# Patient Record
Sex: Male | Born: 1975 | Hispanic: Yes | Marital: Married | Smoking: Current every day smoker
Health system: Southern US, Community
[De-identification: ages and names within clinical notes are randomized; demographics above are authoritative.]

## PROBLEM LIST (undated history)

## (undated) DIAGNOSIS — R519 Headache, unspecified: Secondary | ICD-10-CM

## (undated) DIAGNOSIS — F329 Major depressive disorder, single episode, unspecified: Secondary | ICD-10-CM

## (undated) DIAGNOSIS — F4310 Post-traumatic stress disorder, unspecified: Secondary | ICD-10-CM

## (undated) DIAGNOSIS — R7303 Prediabetes: Secondary | ICD-10-CM

## (undated) DIAGNOSIS — F32A Depression, unspecified: Secondary | ICD-10-CM

## (undated) DIAGNOSIS — N390 Urinary tract infection, site not specified: Secondary | ICD-10-CM

## (undated) HISTORY — PX: CERVICAL DISC ARTHROPLASTY: SHX587

## (undated) HISTORY — PX: TONSILLECTOMY: SUR1361

---
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
## 2012-11-25 ENCOUNTER — Encounter (HOSPITAL_COMMUNITY): Payer: Self-pay | Admitting: *Deleted

## 2012-11-25 ENCOUNTER — Emergency Department (HOSPITAL_COMMUNITY)
Admission: EM | Admit: 2012-11-25 | Discharge: 2012-11-25 | Disposition: A | Discharge status: Home / Self Care | Payer: 59 | Attending: Emergency Medicine | Admitting: Emergency Medicine

## 2012-11-25 DIAGNOSIS — M545 Low back pain, unspecified: Secondary | ICD-10-CM | POA: Insufficient documentation

## 2012-11-25 DIAGNOSIS — F17200 Nicotine dependence, unspecified, uncomplicated: Secondary | ICD-10-CM | POA: Insufficient documentation

## 2012-11-25 DIAGNOSIS — R3915 Urgency of urination: Secondary | ICD-10-CM | POA: Insufficient documentation

## 2012-11-25 DIAGNOSIS — R300 Dysuria: Secondary | ICD-10-CM | POA: Insufficient documentation

## 2012-11-25 DIAGNOSIS — R109 Unspecified abdominal pain: Secondary | ICD-10-CM | POA: Insufficient documentation

## 2012-11-25 DIAGNOSIS — N390 Urinary tract infection, site not specified: Secondary | ICD-10-CM | POA: Insufficient documentation

## 2012-11-25 HISTORY — DX: Urinary tract infection, site not specified: N39.0

## 2012-11-25 LAB — URINALYSIS, ROUTINE W REFLEX MICROSCOPIC
Glucose, UA: NEGATIVE mg/dL
Ketones, ur: NEGATIVE mg/dL
pH: 6 (ref 5.0–8.0)

## 2012-11-25 LAB — URINE MICROSCOPIC-ADD ON

## 2012-11-25 MED ORDER — ONDANSETRON HCL 8 MG PO TABS: 8.0000 mg | ORAL_TABLET | Freq: Three times a day (TID) | ORAL | Status: DC | PRN

## 2012-11-25 MED ORDER — CEFTRIAXONE SODIUM 1 G IJ SOLR
1.0000 g | Freq: Once | INTRAMUSCULAR | Status: AC
  Administered 2012-11-25: 1 g via INTRAMUSCULAR
  Filled 2012-11-25: qty 10

## 2012-11-25 MED ORDER — CEPHALEXIN 750 MG PO CAPS: 750.0000 mg | ORAL_CAPSULE | Freq: Three times a day (TID) | ORAL | Status: DC

## 2012-11-25 MED ORDER — IBUPROFEN 200 MG PO TABS
600.0000 mg | ORAL_TABLET | Freq: Once | ORAL | Status: AC
  Administered 2012-11-25: 600 mg via ORAL
  Filled 2012-11-25: qty 3

## 2012-11-25 MED ORDER — LIDOCAINE HCL 1 % IJ SOLN
INTRAMUSCULAR | Status: AC
  Administered 2012-11-25: 20 mL
  Filled 2012-11-25: qty 20

## 2012-11-25 MED ORDER — HYDROCODONE-ACETAMINOPHEN 5-325 MG PO TABS: 1.0000 | ORAL_TABLET | Freq: Four times a day (QID) | ORAL | Status: DC | PRN

## 2012-11-25 NOTE — ED Provider Notes (Signed)
History     CSN: [id number]  Arrival date & time 11/25/12  1058   First MD Initiated Contact with Patient 11/25/12 1120      Chief Complaint  Patient presents with  . Hematuria    (Consider location/radiation/quality/duration/timing/severity/associated sxs/prior treatment) Patient is a 37 y.o. male presenting with hematuria. The history is provided by the patient.  Hematuria Pertinent negatives include no chest pain, no abdominal pain, no headaches and no shortness of breath.  pt c/o blood in urine and urinary urgency, dysuria, since yesterday. No penile discharge, no known std exposure. No recent trauma or fall. No hx kidney stones. Prior hx single uncomplicated uti, states has same symptoms. Notes suprapubic area and lower back discomfort. No lateralizing pain. No scrotal or testicular pain or swelling. No fever or chills, t 99.6 in ed. No nvd. Having normal bms.      Past Medical History  Diagnosis Date  . UTI (lower urinary tract infection)     Past Surgical History  Procedure Laterality Date  . Cervical disc arthroplasty      No family history on file.  History  Substance Use Topics  . Smoking status: Current Every Day Smoker  . Smokeless tobacco: Not on file  . Alcohol Use: Yes     Comment: 1-2x/month      Review of Systems  Constitutional: Negative for fever and chills.  HENT: Negative for sore throat and neck pain.   Eyes: Negative for redness.  Respiratory: Negative for shortness of breath.   Cardiovascular: Negative for chest pain.  Gastrointestinal: Negative for nausea, vomiting and abdominal pain.  Genitourinary: Positive for hematuria. Negative for flank pain.  Musculoskeletal: Negative for joint swelling.  Skin: Negative for rash.  Neurological: Negative for headaches.  Hematological: Does not bruise/bleed easily.  Psychiatric/Behavioral: Negative for confusion.    Allergies  Review of patient's allergies indicates no known allergies.  Home  Medications   Current Outpatient Rx  Name  Route  Sig  Dispense  Refill  . ibuprofen (ADVIL,MOTRIN) 200 MG tablet   Oral   Take 400 mg by mouth every 6 (six) hours as needed for pain.           BP 122/67  Pulse 103  Temp(Src) 99.6 F (37.6 C) (Oral)  Resp 15  SpO2 98%  Physical Exam  Nursing note and vitals reviewed. Constitutional: He is oriented to person, place, and time. He appears well-developed and well-nourished. No distress.  HENT:  Nose: Nose normal.  Mouth/Throat: Oropharynx is clear and moist.  Eyes: Conjunctivae are normal. No scleral icterus.  Neck: Neck supple. No tracheal deviation present.  Cardiovascular: Normal rate, normal heart sounds and intact distal pulses.   Pulmonary/Chest: Effort normal. No accessory muscle usage. No respiratory distress.  Abdominal: Soft. Bowel sounds are normal. He exhibits no distension and no mass. There is no tenderness. There is no rebound and no guarding.  Genitourinary:  No cva tenderness. Normal ext genitalia, testes desc bil, non tender. No penile discharge.   Musculoskeletal: Normal range of motion. He exhibits no edema.  Neurological: He is alert and oriented to person, place, and time.  Skin: Skin is warm and dry. No rash noted. He is not diaphoretic.  Psychiatric: He has a normal mood and affect.    ED Course  Procedures (including critical care time)   Results for orders placed during the hospital encounter of 11/25/12  URINALYSIS, ROUTINE W REFLEX MICROSCOPIC      Result Value Range  Color, Urine AMBER (*) YELLOW   APPearance CLOUDY (*) CLEAR   Specific Gravity, Urine 1.024  1.005 - 1.030   pH 6.0  5.0 - 8.0   Glucose, UA NEGATIVE  NEGATIVE mg/dL   Hgb urine dipstick LARGE (*) NEGATIVE   Bilirubin Urine SMALL (*) NEGATIVE   Ketones, ur NEGATIVE  NEGATIVE mg/dL   Protein, ur 30 (*) NEGATIVE mg/dL   Urobilinogen, UA 1.0  0.0 - 1.0 mg/dL   Nitrite NEGATIVE  NEGATIVE   Leukocytes, UA LARGE (*) NEGATIVE   URINE MICROSCOPIC-ADD ON      Result Value Range   Squamous Epithelial / LPF RARE  RARE   WBC, UA 11-20  <3 WBC/hpf   RBC / HPF 11-20  <3 RBC/hpf   Bacteria, UA FEW (*) RARE   Urine-Other MUCOUS PRESENT         MDM  Labs.  Reviewed nursing notes and prior charts for additional history.    Rocephin im.  Recheck taking po, abd soft nt. No nv.   Hr 84 rr 16. Pt appears stable for d/c.   Given recurrent uti, will refer to urology for follow up.  Urine culture pending.        Suzi Roots, MD 11/25/12 1229

## 2012-11-25 NOTE — ED Notes (Signed)
Pt reports blood in urine since yesterday. Sts Hx of same and was Dx with dehydration and UTI. C/o urinary frequency, urgency and burning.

## 2012-11-25 NOTE — Progress Notes (Signed)
Noted no pcp listed Confirms with pt he does not have a pcp CM reviewed with pt how to obtain an in network pcp if recommended at d/c via [facility] toll free number or web site Pt voiced understanding and appreciation

## 2012-11-25 NOTE — ED Notes (Signed)
MD at bedside. 

## 2012-11-27 LAB — URINE CULTURE

## 2014-02-01 ENCOUNTER — Ambulatory Visit (INDEPENDENT_AMBULATORY_CARE_PROVIDER_SITE_OTHER): Payer: 59 | Admitting: Family Medicine

## 2014-02-01 ENCOUNTER — Ambulatory Visit: Payer: 59

## 2014-02-01 VITALS — BP 122/76 | HR 82 | Temp 98.9°F | Resp 16 | Ht 75.0 in | Wt 274.0 lb

## 2014-02-01 DIAGNOSIS — M545 Low back pain, unspecified: Secondary | ICD-10-CM

## 2014-02-01 DIAGNOSIS — IMO0002 Reserved for concepts with insufficient information to code with codable children: Secondary | ICD-10-CM

## 2014-02-01 DIAGNOSIS — M5430 Sciatica, unspecified side: Secondary | ICD-10-CM

## 2014-02-01 MED ORDER — PREDNISONE 20 MG PO TABS: ORAL_TABLET | ORAL | Status: DC

## 2014-02-01 MED ORDER — CYCLOBENZAPRINE HCL 10 MG PO TABS: ORAL_TABLET | ORAL | Status: DC

## 2014-02-01 MED ORDER — OXYCODONE-ACETAMINOPHEN 5-325 MG PO TABS: 1.0000 | ORAL_TABLET | Freq: Three times a day (TID) | ORAL | Status: DC | PRN

## 2014-02-01 MED ORDER — MELOXICAM 15 MG PO TABS: 15.0000 mg | ORAL_TABLET | Freq: Every day | ORAL | Status: DC | PRN

## 2014-02-01 NOTE — Progress Notes (Signed)
Chief Complaint:  Chief Complaint  Patient presents with  . Back Pain    Lower, X 2 days    HPI: Tyler Atkins is a 38 y.o. male who is here for  2 day history of low back pain after helping sister in law move Friday morning, he has some shocks being sent down the back of his thighs on both sides, worse when he lays down. 10/10. He has tried advil and aleve without releif. He has had prior back injuries but nothing like this. He deneis incontinence.  No fevers or chills. He does not feel weak. Getting up the next few step are dreadful.  HE has worse pain sleeping. He has a cervical disc herniation and this feels worse.   Past Medical History  Diagnosis Date  . UTI (lower urinary tract infection)    Past Surgical History  Procedure Laterality Date  . Cervical disc arthroplasty     History   Social History  . Marital Status: Married    Spouse Name: N/A    Number of Children: N/A  . Years of Education: N/A   Social History Main Topics  . Smoking status: Current Every Day Smoker  . Smokeless tobacco: None  . Alcohol Use: Yes     Comment: 1-2x/month  . Drug Use: No  . Sexual Activity: None   Other Topics Concern  . None   Social History Narrative  . None   History reviewed. No pertinent family history. No Known Allergies Prior to Admission medications   Not on File     ROS: The patient denies fevers, chills, night sweats, unintentional weight loss, chest pain, palpitations, wheezing, dyspnea on exertion, nausea, vomiting, abdominal pain, dysuria, hematuria, melena,   All other systems have been reviewed and were otherwise negative with the exception of those mentioned in the HPI and as above.    PHYSICAL EXAM: Filed Vitals:   02/01/14 0945  BP: 122/76  Pulse: 82  Temp: 98.9 F (37.2 C)  Resp: 16   Filed Vitals:   02/01/14 0945  Height: 6\' 3"  (1.905 m)  Weight: 274 lb (124.286 kg)   Body mass index is 34.25 kg/(m^2).  General: Alert, no acute  distress HEENT:  Normocephalic, atraumatic, oropharynx patent. EOMI, PERRLA Cardiovascular:  Regular rate and rhythm, no rubs murmurs or gallops.  No Carotid bruits, radial pulse intact. No pedal edema.  Respiratory: Clear to auscultation bilaterally.  No wheezes, rales, or rhonchi.  No cyanosis, no use of accessory musculature GI: No organomegaly, abdomen is soft and non-tender, positive bowel sounds.  No masses. Skin: No rashes. Neurologic: Facial musculature symmetric. Psychiatric: Patient is appropriate throughout our interaction. Lymphatic: No cervical lymphadenopathy Musculoskeletal: Gait intact. + paramsk tenderness  Decrease ROM in flexion due to pain 5/5 strength, 2/2 DTRs No saddle anesthesia Straight leg positvie Hip and knee exam--normal    LABS:    EKG/XRAY:   Primary [facility]. Neg fx/dislocation   ASSESSMENT/PLAN: Encounter Diagnoses  Name Primary?  . Lumbar pain Yes  . Sprain and strain of back   . Sciatica    Rx prednisone taper, flexeril, roxicet Rx mobic, to take prn after predniosone is completed Recautions given  Work note given F/u prn Normal narcotic profile   Gross sideeffects, risk and benefits, and alternatives of medications d/w patient. Patient is aware that all medications have potential sideeffects and we are unable to predict every sideeffect or drug-drug interaction that may  occur.  Lenell Antuhao P Le, DO 02/01/2014 11:41 AM

## 2014-02-01 NOTE — Patient Instructions (Signed)
Sciatica °Sciatica is pain, weakness, numbness, or tingling along the path of the sciatic nerve. The nerve starts in the lower back and runs down the back of each leg. The nerve controls the muscles in the lower leg and in the back of the knee, while also providing sensation to the back of the thigh, lower leg, and the sole of your foot. Sciatica is a symptom of another medical condition. For instance, nerve damage or certain conditions, such as a herniated disk or bone spur on the spine, pinch or put pressure on the sciatic nerve. This causes the pain, weakness, or other sensations normally associated with sciatica. Generally, sciatica only affects one side of the body. °CAUSES  °· Herniated or slipped disc. °· Degenerative disk disease. °· A pain disorder involving the narrow muscle in the buttocks (piriformis syndrome). °· Pelvic injury or fracture. °· Pregnancy. °· Tumor (rare). °SYMPTOMS  °Symptoms can vary from mild to very severe. The symptoms usually travel from the low back to the buttocks and down the back of the leg. Symptoms can include: °· Mild tingling or dull aches in the lower back, leg, or hip. °· Numbness in the back of the calf or sole of the foot. °· Burning sensations in the lower back, leg, or hip. °· Sharp pains in the lower back, leg, or hip. °· Leg weakness. °· Severe back pain inhibiting movement. °These symptoms may get worse with coughing, sneezing, laughing, or prolonged sitting or standing. Also, being overweight may worsen symptoms. °DIAGNOSIS  °Your caregiver will perform a physical exam to look for common symptoms of sciatica. He or she may ask you to do certain movements or activities that would trigger sciatic nerve pain. Other tests may be performed to find the cause of the sciatica. These may include: °· Blood tests. °· X-rays. °· Imaging tests, such as an MRI or CT scan. °TREATMENT  °Treatment is directed at the cause of the sciatic pain. Sometimes, treatment is not necessary  and the pain and discomfort goes away on its own. If treatment is needed, your caregiver may suggest: °· Over-the-counter medicines to relieve pain. °· Prescription medicines, such as anti-inflammatory medicine, muscle relaxants, or narcotics. °· Applying heat or ice to the painful area. °· Steroid injections to lessen pain, irritation, and inflammation around the nerve. °· Reducing activity during periods of pain. °· Exercising and stretching to strengthen your abdomen and improve flexibility of your spine. Your caregiver may suggest losing weight if the extra weight makes the back pain worse. °· Physical therapy. °· Surgery to eliminate what is pressing or pinching the nerve, such as a bone spur or part of a herniated disk. °HOME CARE INSTRUCTIONS  °· Only take over-the-counter or prescription medicines for pain or discomfort as directed by your caregiver. °· Apply ice to the affected area for 20 minutes, 3 4 times a day for the first 48 72 hours. Then try heat in the same way. °· Exercise, stretch, or perform your usual activities if these do not aggravate your pain. °· Attend physical therapy sessions as directed by your caregiver. °· Keep all follow-up appointments as directed by your caregiver. °· Do not wear high heels or shoes that do not provide proper support. °· Check your mattress to see if it is too soft. A firm mattress may lessen your pain and discomfort. °SEEK IMMEDIATE MEDICAL CARE IF:  °· You lose control of your bowel or bladder (incontinence). °· You have increasing weakness in the lower back,   pelvis, buttocks, or legs. °· You have redness or swelling of your back. °· You have a burning sensation when you urinate. °· You have pain that gets worse when you lie down or awakens you at night. °· Your pain is worse than you have experienced in the past. °· Your pain is lasting longer than 4 weeks. °· You are suddenly losing weight without reason. °MAKE SURE YOU: °· Understand these  instructions. °· Will watch your condition. °· Will get help right away if you are not doing well or get worse. °Document Released: 09/04/2001 Document Revised: 03/11/2012 Document Reviewed: 01/20/2012 °[employer]® Patient Information ©2014 [employer]. °Back Pain, Adult °Low back pain is very common. About 1 in 5 people have back pain. The cause of low back pain is rarely dangerous. The pain often gets better over time. About half of people with a sudden onset of back pain feel better in just 2 weeks. About 8 in 10 people feel better by 6 weeks.  °CAUSES °Some common causes of back pain include: °· Strain of the muscles or ligaments supporting the spine. °· Wear and tear (degeneration) of the spinal discs. °· Arthritis. °· Direct injury to the back. °DIAGNOSIS °Most of the time, the direct cause of low back pain is not known. However, back pain can be treated effectively even when the exact cause of the pain is unknown. Answering your caregiver's questions about your overall health and symptoms is one of the most accurate ways to make sure the cause of your pain is not dangerous. If your caregiver needs more information, he or she may order lab work or imaging tests (X-rays or MRIs). However, even if imaging tests show changes in your back, this usually does not require surgery. °HOME CARE INSTRUCTIONS °For many people, back pain returns. Since low back pain is rarely dangerous, it is often a condition that people can learn to manage on their own.  °· Remain active. It is stressful on the back to sit or stand in one place. Do not sit, drive, or stand in one place for more than 30 minutes at a time. Take short walks on level surfaces as soon as pain allows. Try to increase the length of time you walk each day. °· Do not stay in bed. Resting more than 1 or 2 days can delay your recovery. °· Do not avoid exercise or work. Your body is made to move. It is not dangerous to be active, even though your back may hurt. Your  back will likely heal faster if you return to being active before your pain is gone. °· Pay attention to your body when you  bend and lift. Many people have less discomfort when lifting if they bend their knees, keep the load close to their bodies, and avoid twisting. Often, the most comfortable positions are those that put less stress on your recovering back. °· Find a comfortable position to sleep. Use a firm mattress and lie on your side with your knees slightly bent. If you lie on your back, put a pillow under your knees. °· Only take over-the-counter or prescription medicines as directed by your caregiver. Over-the-counter medicines to reduce pain and inflammation are often the most helpful. Your caregiver may prescribe muscle relaxant drugs. These medicines help dull your pain so you can more quickly return to your normal activities and healthy exercise. °· Put ice on the injured area. °· Put ice in a plastic bag. °· Place a towel between your skin and the bag. °· Leave the ice on for 15-20 minutes, 03-04 times a day for   the first 2 to 3 days. After that, ice and heat may be alternated to reduce pain and spasms. °· Ask your caregiver about trying back exercises and gentle massage. This may be of some benefit. °· Avoid feeling anxious or stressed. Stress increases muscle tension and can worsen back pain. It is important to recognize when you are anxious or stressed and learn ways to manage it. Exercise is a great option. °SEEK MEDICAL CARE IF: °· You have pain that is not relieved with rest or medicine. °· You have pain that does not improve in 1 week. °· You have new symptoms. °· You are generally not feeling well. °SEEK IMMEDIATE MEDICAL CARE IF:  °· You have pain that radiates from your back into your legs. °· You develop new bowel or bladder control problems. °· You have unusual weakness or numbness in your arms or legs. °· You develop nausea or vomiting. °· You develop abdominal pain. °· You feel  faint. °Document Released: 09/10/2005 Document Revised: 03/11/2012 Document Reviewed: 01/29/2011 °ExitCare® Patient Information ©2014 [employer]. ° °

## 2014-07-15 ENCOUNTER — Ambulatory Visit (INDEPENDENT_AMBULATORY_CARE_PROVIDER_SITE_OTHER): Payer: BC Managed Care – PPO | Admitting: Family Medicine

## 2014-07-15 VITALS — BP 114/76 | HR 73 | Temp 98.8°F | Resp 18 | Ht 74.0 in | Wt 261.8 lb

## 2014-07-15 DIAGNOSIS — M5441 Lumbago with sciatica, right side: Secondary | ICD-10-CM

## 2014-07-15 MED ORDER — OXYCODONE-ACETAMINOPHEN 5-325 MG PO TABS: 1.0000 | ORAL_TABLET | Freq: Three times a day (TID) | ORAL | Status: DC | PRN

## 2014-07-15 MED ORDER — PREDNISONE 20 MG PO TABS: ORAL_TABLET | ORAL | Status: DC

## 2014-07-15 NOTE — Patient Instructions (Signed)
We will work on getting you set up for an MRI of your back. This will help us determine exactly what is going on in your spine- I suspect you have a bulging disc pushing on a nerve and causing pain.  Use the steroid as directed and the pain medication as needed.  Don't forget that pain medication can cause constipation- you should add a stool softener daily to prevent painful stools.

## 2014-07-15 NOTE — Progress Notes (Signed)
[facility] [address], [place] [id number] [phone]  Date:  07/15/2014   Name:  Tyler BaizeJose Atkins   DOB:  11/03/1975   MRN:  [id number]  PCP:  No PCP Per Patient    Chief Complaint: Back Pain   History of Present Illness:  Tyler BaizeJose Atkins is a 38 y.o. very pleasant male patient who presents with the following:  He is here today with recurrent back pain. He was here in May with the same sx.  He is again having pain down his right leg.  We used prednisone and mobic in the Spring- he felt better "about the next day."  Sx returned shortly after he finished the prednisone.  He has has this pain off an on for the last 4-5 years now.  No lumbar MRI yet He did have surgery on his cervical spine in the past.  [place]   He is otherwise generally in good health.   He does note numbness and weakness in his leg that will come and go.  He is ok when he is laying down, worse with standing or prolonged sitting.  Yesterday he noted that he had pain with BM due to constipation, but no genital numbness or accidents.    He is otherwise generally in good health  There are no active problems to display for this patient.   Past Medical History  Diagnosis Date  . UTI (lower urinary tract infection)     Past Surgical History  Procedure Laterality Date  . Cervical disc arthroplasty      History  Substance Use Topics  . Smoking status: Current Every Day Smoker  . Smokeless tobacco: Not on file  . Alcohol Use: Yes     Comment: 1-2x/month    Family History  Problem Relation Age of Onset  . Cancer Mother   . Heart disease Father     No Known Allergies  Medication list has been reviewed and updated.  No current outpatient prescriptions on file prior to visit.   No current facility-administered medications on file prior to visit.    Review of Systems:  As per HPI- otherwise negative.   Physical Examination: Filed Vitals:   07/15/14  1236  BP: 114/76  Pulse: 73  Temp: 98.8 F (37.1 C)  Resp: 18   Filed Vitals:   07/15/14 1236  Height: 6\' 2"  (1.88 m)  Weight: 261 lb 12.8 oz (118.752 kg)   Body mass index is 33.6 kg/(m^2). Ideal Body Weight: Weight in (lb) to have BMI = 25: 194.3  GEN: WDWN, NAD, Non-toxic, A & O x 3, tall, looks well HEENT: Atraumatic, Normocephalic. Neck supple. No masses, No LAD. Ears and Nose: No external deformity. CV: RRR, No M/G/R. No JVD. No thrill. No extra heart sounds. PULM: CTA B, no wheezes, crackles, rhonchi. No retractions. No resp. distress. No accessory muscle use. EXTR: No c/c/e NEURO Normal gait.  PSYCH: Normally interactive. Conversant. Not depressed or anxious appearing.  Calm demeanor.  Lumbar spine: he is tender over the right sciatic notch.   Pain down right leg. positive SLR on the right.  Normal strength and sensation of both LE, reduced patellar DTR- symmetrical bilaterally  Assessment and Plan: Right-sided low back pain with right-sided sciatica - Plan: MR Lumbar Spine Wo Contrast, predniSONE (DELTASONE) 20 MG tablet, oxyCODONE-acetaminophen (ROXICET) 5-325 MG per tablet  Right sided lower back pain with radiation down leg.  Will treat with  prednisone as above and pain medication. Cautioned regarding sedation, no driving while on percocet.  Will work towards MRI  Cautioned regarding signs and sx of cauda equina- seek care right away if these recur.  Let us know if not feeling better in the next couple of days  Signed Abbe AmsterdamJessica Copland, MD

## 2014-07-22 ENCOUNTER — Telehealth: Payer: Self-pay | Admitting: Family Medicine

## 2014-07-22 ENCOUNTER — Ambulatory Visit
Admission: RE | Admit: 2014-07-22 | Discharge: 2014-07-22 | Disposition: A | Discharge status: Home / Self Care | Payer: BC Managed Care – PPO | Source: Ambulatory Visit | Attending: Family Medicine | Admitting: Family Medicine

## 2014-07-22 DIAGNOSIS — M5020 Other cervical disc displacement, unspecified cervical region: Secondary | ICD-10-CM

## 2014-07-22 DIAGNOSIS — M5126 Other intervertebral disc displacement, lumbar region: Secondary | ICD-10-CM

## 2014-07-22 DIAGNOSIS — M5441 Lumbago with sciatica, right side: Secondary | ICD-10-CM

## 2014-07-22 NOTE — Telephone Encounter (Signed)
Called to go over his MRI report.  He does have nerve impingement. He is feeling better but now 100%, will soon finish his prednisone taper.    EXAM: MRI LUMBAR SPINE WITHOUT CONTRAST  TECHNIQUE: Multiplanar, multisequence MR imaging of the lumbar spine was performed. No intravenous contrast was administered.  [facility]  FINDINGS: Normal lumbar segmentation depicted on comparison. Stable and normal vertebral height and alignment. No marrow edema or evidence of acute osseous abnormality.  Visualized lower thoracic spinal cord is normal with conus medularis at L1.  Visualized abdominal viscera and paraspinal soft tissues are within normal limits.  T12-L1: Negative.  L1-L2: Negative.  L2-L3: Negative.  L3-L4: Negative.  L4-L5: Mild disc desiccation. Circumferential disc bulge, broad-based posterior and biforaminal component of disc. Some endplate osteophytosis. Mild degenerative L4 inferior endplate changes. No significant spinal stenosis. No convincing foraminal stenosis.  However, there is subtle caudal disc extrusion to the right of midline, best seen on series 5, image 6 and series 3, image 6 and series 6, image 27 corresponding to the L5 vertebral body level. This appears to be a sequestered fragment measuring 6 x 8 x 15 mm (AP by transverse by CC). This severely affects the descending right L5 nerve roots in the lateral recess.  L5-S1: Negative.  IMPRESSION: 1. 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 2. No significant lumbar spinal stenosis, and other levels are within normal limits for age.   Will refer to NSG for further evaluation and treatment.  He will seek care if any worsening in the meantime.

## 2014-09-22 ENCOUNTER — Ambulatory Visit (INDEPENDENT_AMBULATORY_CARE_PROVIDER_SITE_OTHER): Payer: BC Managed Care – PPO | Admitting: Emergency Medicine

## 2014-09-22 VITALS — BP 122/76 | HR 77 | Temp 97.6°F | Resp 18 | Ht 74.75 in | Wt 275.0 lb

## 2014-09-22 DIAGNOSIS — J0140 Acute pansinusitis, unspecified: Secondary | ICD-10-CM

## 2014-09-22 DIAGNOSIS — J209 Acute bronchitis, unspecified: Secondary | ICD-10-CM

## 2014-09-22 MED ORDER — HYDROCOD POLST-CHLORPHEN POLST 10-8 MG/5ML PO LQCR: 5.0000 mL | Freq: Two times a day (BID) | ORAL | Status: DC | PRN

## 2014-09-22 MED ORDER — PSEUDOEPHEDRINE-GUAIFENESIN ER 60-600 MG PO TB12: 1.0000 | ORAL_TABLET | Freq: Two times a day (BID) | ORAL | Status: AC

## 2014-09-22 MED ORDER — AMOXICILLIN-POT CLAVULANATE 875-125 MG PO TABS: 1.0000 | ORAL_TABLET | Freq: Two times a day (BID) | ORAL | Status: DC

## 2014-09-22 NOTE — Progress Notes (Signed)
  [facility] [address], [place] [id number] [phone]  Date:  09/22/2014   Name:  Tyler Atkins   DOB:  07/08/1976   MRN:  [id number]  PCP:  No PCP Per Patient    Chief Complaint: Nasal Congestion; Cough; Fever; and Fatigue   History of Present Illness:  Tyler Atkins is a 38 y.o. very pleasant male patient who presents with the following:  Ill for three weeks with fever, chills, malaise and myalgias Night sweats Has nasal congestion and clear nasal drainage.  Has purulent post nasal drainage. Sore throat Cough productive of purulent sputum. Some wheezing and exertional shortness of breath past few days No nausea or vomiting.  No stool change No rash No improvement with over the counter medications or other home remedies.  Denies other complaint or health concern today.   There are no active problems to display for this patient.   Past Medical History  Diagnosis Date  . UTI (lower urinary tract infection)     Past Surgical History  Procedure Laterality Date  . Cervical disc arthroplasty      History  Substance Use Topics  . Smoking status: Current Every Day Smoker  . Smokeless tobacco: Not on file  . Alcohol Use: Yes     Comment: 1-2x/month    Family History  Problem Relation Age of Onset  . Cancer Mother   . Heart disease Father     No Known Allergies  Medication list has been reviewed and updated.  Current Outpatient Prescriptions on File Prior to Visit  Medication Sig Dispense Refill  . oxyCODONE-acetaminophen (ROXICET) 5-325 MG per tablet Take 1-2 tablets by mouth every 8 (eight) hours as needed for severe pain. (Patient not taking: Reported on 09/22/2014) 30 tablet 0  . predniSONE (DELTASONE) 20 MG tablet Take 3 tabs po daily x 3 days, then 2 tabs po daily x 3 days, then 1 tab po x 3 days, then 1/2 tab po daily x 3 days (Patient not taking: Reported on 09/22/2014) 20 tablet 0   No current facility-administered medications  on file prior to visit.    Review of Systems:  As per HPI, otherwise negative.    Physical Examination: Filed Vitals:   09/22/14 1449  BP: 122/76  Pulse: 77  Temp: 97.6 F (36.4 C)  Resp: 18   Filed Vitals:   09/22/14 1449  Height: 6' 2.75" (1.899 m)  Weight: 275 lb (124.739 kg)   Body mass index is 34.59 kg/(m^2). Ideal Body Weight: Weight in (lb) to have BMI = 25: 198.3  GEN: WDWN, moderate distress, Non-toxic, A & O x 3 HEENT: Atraumatic, Normocephalic. Neck supple. No masses, No LAD. Ears and Nose: No external deformity. CV: RRR, No M/G/R. No JVD. No thrill. No extra heart sounds. PULM: CTA B, no wheezes, crackles, rhonchi. No retractions. No resp. distress. No accessory muscle use. ABD: S, NT, ND, +BS. No rebound. No HSM. EXTR: No c/c/e NEURO Normal gait.  PSYCH: Normally interactive. Conversant. Not depressed or anxious appearing.  Calm demeanor.    Assessment and Plan: Sinusitis Bronchitis augmentin mucinex tussionex   Signed,  Phillips OdorJeffery Devarius Nelles, MD

## 2014-09-22 NOTE — Patient Instructions (Signed)

## 2017-06-03 ENCOUNTER — Emergency Department (HOSPITAL_COMMUNITY)
Admission: EM | Admit: 2017-06-03 | Discharge: 2017-06-03 | Disposition: A | Discharge status: Home / Self Care | Payer: Self-pay | Attending: Emergency Medicine | Admitting: Emergency Medicine

## 2017-06-03 ENCOUNTER — Encounter (HOSPITAL_COMMUNITY): Payer: Self-pay

## 2017-06-03 DIAGNOSIS — M5442 Lumbago with sciatica, left side: Secondary | ICD-10-CM | POA: Insufficient documentation

## 2017-06-03 DIAGNOSIS — F17210 Nicotine dependence, cigarettes, uncomplicated: Secondary | ICD-10-CM | POA: Insufficient documentation

## 2017-06-03 HISTORY — DX: Post-traumatic stress disorder, unspecified: F43.10

## 2017-06-03 MED ORDER — METHOCARBAMOL 500 MG PO TABS: 500.0000 mg | ORAL_TABLET | Freq: Two times a day (BID) | ORAL | 0 refills | Status: DC

## 2017-06-03 MED ORDER — NAPROXEN 500 MG PO TABS: 500.0000 mg | ORAL_TABLET | Freq: Two times a day (BID) | ORAL | 0 refills | Status: DC

## 2017-06-03 NOTE — Discharge Instructions (Signed)
Please take muscle relaxer medication (Robaxin) and anti-inflammatory medication (Naprosyn) daily for you low back pain  Continue with applying heat  [facility] an appointment with [facility] for health maintenance concerns and for follow up of low back pain  Continue taking the antibiotic prescribed to you by your doctor.  Please return if you have difficulty urinating, have new numbness or new or worsening symptoms

## 2017-06-03 NOTE — ED Provider Notes (Signed)
[facility]-EMERGENCY DEPT Provider Note   CSN: [id number] Arrival date & time: 06/03/17  1110     History   Chief Complaint Chief Complaint  Patient presents with  . Back Pain    HPI Tyler Atkins is a 41 y.o. male.  HPI   Tyler Atkins is a 41yo male with a history of chronic lower back pain, cervical disc arthroplasty, PTSD who presents to the Emergency Department for evaluation of lower back pain and tingling down his left leg. Pain started approximately 9 days ago, has gradually worsened and is now sharp and 10/10 in severity. It is located in the middle lower back, and radiates down his left leg when he moves from a sitting to standing position. Walking and sitting make the pain worse, he has not taken any medication for pain. He has tried heat on the back without improvement. He denies inciting injury. He states that he walks about 8miles a day at work and his pain is making his job more difficult. He also states that he saw the dentist for a tooth ache 10 days ago and was given tylenol with codeine, an antibiotic and scheduled to get the tooth pulled in a few days. He thinks the back pain is worse when taking the antibiotic for his dental pain. He denies past history of cancer, denies IVDU. Denies abdominal pain, N/V, diarrhea, fever, hematuria, urinary retention, incontinence, saddle anesthesia, weakness. He states that he has had episodes of lower back pain in the past, and got epidural steroid injection years ago which helped him. He states that he does not have a Primary Care Provider.    Past Medical History:  Diagnosis Date  . PTSD (post-traumatic stress disorder)   . UTI (lower urinary tract infection)     There are no active problems to display for this patient.   Past Surgical History:  Procedure Laterality Date  . CERVICAL DISC ARTHROPLASTY         Home Medications    Prior to Admission medications   Medication Sig Start Date End Date Taking? Authorizing Provider    acetaminophen-codeine (TYLENOL #3) 300-30 MG tablet Take 1 tablet by mouth every 6 (six) hours as needed. for pain 05/22/17  Yes [provider]  amoxicillin (AMOXIL) 500 MG capsule TAKE 1 TABLET BY MOUTH EVERY 8 HOURS UNTIL GONE 05/22/17  Yes [provider]  sertraline (ZOLOFT) 100 MG tablet Take 100 mg by mouth daily. 05/20/17  Yes [provider]  amoxicillin-clavulanate (AUGMENTIN) 875-125 MG per tablet Take 1 tablet by mouth 2 (two) times daily. Patient not taking: Reported on 06/03/2017 09/22/14   Carmelina DaneAnderson, Jeffery S, MD  chlorpheniramine-HYDROcodone [facility](TUSSIONEX PENNKINETIC ER) 10-8 MG/[facility]ML [facility] Take 5 mLs by mouth every 12 (twelve) hours as needed. Patient not taking: Reported on 06/03/2017 09/22/14   Carmelina DaneAnderson, Jeffery S, MD  methocarbamol (ROBAXIN) 500 MG tablet Take 1 tablet (500 mg total) by mouth 2 (two) times daily. 06/03/17   Kellie ShropshireShrosbree, Shaconda Hajduk J, PA-C  naproxen (NAPROSYN) 500 MG tablet Take 1 tablet (500 mg total) by mouth 2 (two) times daily. 06/03/17   Kellie ShropshireShrosbree, Chasiti Waddington J, PA-C  oxyCODONE-acetaminophen (ROXICET) 5-325 MG per tablet Take 1-2 tablets by mouth every 8 (eight) hours as needed for severe pain. Patient not taking: Reported on 09/22/2014 07/15/14   Copland, Gwenlyn FoundJessica C, MD  predniSONE (DELTASONE) 20 MG tablet Take 3 tabs po daily x 3 days, then 2 tabs po daily x 3 days, then 1 tab po x 3 days, then  1/2 tab po daily x 3 days Patient not taking: Reported on 09/22/2014 07/15/14   Copland, Gwenlyn Found, MD    Family History Family History  Problem Relation Age of Onset  . Cancer Mother   . Heart disease Father     Social History Social History  Substance Use Topics  . Smoking status: Current Every Day Smoker    Packs/day: 1.00    Types: Cigarettes  . Smokeless tobacco: Never Used  . Alcohol use Yes     Comment: 1-2x/month     Allergies   Patient has no known allergies.   Review of Systems Review of Systems  Constitutional: Negative for  appetite change, chills and fever.  Gastrointestinal: Negative for abdominal pain, diarrhea, nausea and vomiting.  Genitourinary: Negative for difficulty urinating, dysuria, flank pain, hematuria and testicular pain.  Musculoskeletal: Positive for back pain. Negative for gait problem, neck pain and neck stiffness.  Skin: Negative for rash and wound.  Neurological: Negative for weakness, light-headedness, numbness and headaches.     Physical Exam Updated Vital Signs BP (!) 139/94 (BP Location: Left Arm)   Pulse 68   Temp 98.9 F (37.2 C) (Oral)   Resp 20   Ht  (1.905 m)   Wt 127 kg (280 lb)   SpO2 99%   BMI 35.00 kg/m   Physical Exam  Constitutional: He is oriented to person, place, and time. He appears well-developed and well-nourished. No distress.  HENT:  Head: Normocephalic and atraumatic.  Eyes: Pupils are equal, round, and reactive to light. Conjunctivae and EOM are normal. Right eye exhibits no discharge. Left eye exhibits no discharge.  Neck: Normal range of motion. Neck supple.  Pulmonary/Chest: Effort normal. No respiratory distress.  Abdominal: Soft. Bowel sounds are normal. He exhibits no distension. There is no tenderness. There is no guarding.  No CVA tenderness, no pulsatile mass. Murphy's sign negative.   Musculoskeletal:  Tenderness to palpation over paraspinal muscles of the lumbar spine. No point tenderness to vertebrae. Strength 5/5 in bilateral lower extremities. Positive straight leg raise test on the left leg.   Neurological: He is alert and oriented to person, place, and time. Coordination normal.  Mental Status:  Alert, oriented, thought content appropriate, able to give a coherent history. Speech fluent without evidence of aphasia. Able to follow 2 step commands without difficulty.  Cranial Nerves:  II:  Peripheral visual fields grossly normal, pupils equal, round, reactive to light III,IV, VI: ptosis not present, extra-ocular motions intact  bilaterally  V,VII: smile symmetric, facial light touch sensation equal VIII: hearing grossly normal to voice  X: uvula elevates symmetrically  XI: bilateral shoulder shrug symmetric and strong XII: midline tongue extension without fassiculations Motor:  Normal tone. 5/5 in upper and lower extremities bilaterally including strong and equal grip strength and dorsiflexion/plantar flexion Sensory: Pinprick and light touch normal in all extremities.  Deep Tendon Reflexes: 2+ and symmetric in the biceps and achilles Cerebellar: normal finger-to-nose with bilateral upper extremities Gait: Patient is able to ambulate independently. Has excessive pain with standing, but otherwise no difficulty with balance.    Skin: Skin is warm and dry. He is not diaphoretic.  Psychiatric: He has a normal mood and affect. His behavior is normal.  Nursing note and vitals reviewed.    ED Treatments / Results  Labs (all labs ordered are listed, but only abnormal results are displayed) Labs Reviewed - No data to display  EKG  EKG Interpretation None  Radiology No results found.  Procedures Procedures (including critical care time)  Medications Ordered in ED Medications - No data to display   Initial Impression / Assessment and Plan / ED Course  I have reviewed the triage vital signs and the nursing notes.  Pertinent labs & imaging results that were available during my care of the patient were reviewed by me and considered in my medical decision making (see chart for details).      Patient with lower back pain and sciatica. No neurological deficits and normal neuro exam.  Patient can walk but states is painful. No loss of bowel or bladder control. No concern for cauda equina.  No fever, night sweats, weight loss, h/o cancer, IVDU. RICE protocol and pain medicine indicated and discussed with patient. He was also given exercises for sciatica.   Patient given information to set up an  appointment with PCP at [facility] for general health maintenance concerns and for follow up of low back pain. He voices understanding to above plan and agrees to discharge.    MDM Number of Diagnoses or Management Options Acute left-sided low back pain with left-sided sciatica:     Final Clinical Impressions(s) / ED Diagnoses   Final diagnoses:  Acute left-sided low back pain with left-sided sciatica    New Prescriptions New Prescriptions   METHOCARBAMOL (ROBAXIN) 500 MG TABLET    Take 1 tablet (500 mg total) by mouth 2 (two) times daily.   NAPROXEN (NAPROSYN) 500 MG TABLET    Take 1 tablet (500 mg total) by mouth 2 (two) times daily.   b   Kellie Shropshire, PA-C 06/03/17 1419    Rolland Porter, MD 06/10/17 [phone]

## 2017-06-03 NOTE — ED Triage Notes (Signed)
Patient c/o bilateral lowe back pain since last week after taking antibiotics and Tylenol w/Codeine. Patient states the pain radiates down the left leg.

## 2017-10-01 ENCOUNTER — Ambulatory Visit (INDEPENDENT_AMBULATORY_CARE_PROVIDER_SITE_OTHER): Payer: Managed Care, Other (non HMO) | Admitting: Emergency Medicine

## 2017-10-01 ENCOUNTER — Encounter: Payer: Self-pay | Admitting: Emergency Medicine

## 2017-10-01 ENCOUNTER — Other Ambulatory Visit: Payer: Self-pay

## 2017-10-01 VITALS — BP 115/74 | HR 82 | Temp 98.5°F | Resp 16 | Ht 74.0 in | Wt 296.6 lb

## 2017-10-01 DIAGNOSIS — Z0000 Encounter for general adult medical examination without abnormal findings: Secondary | ICD-10-CM | POA: Diagnosis not present

## 2017-10-01 NOTE — Patient Instructions (Addendum)
   IF you received an x-ray today, you will receive an invoice from [facility]. Please contact [facility] at [phone] with questions or concerns regarding your invoice.   IF you received labwork today, you will receive an invoice from [facility]. Please contact [facility] at [phone] with questions or concerns regarding your invoice.   Our billing staff will not be able to assist you with questions regarding bills from these companies.  You will be contacted with the lab results as soon as they are available. The fastest way to get your results is to activate your My Chart account. Instructions are located on the last page of this paperwork. If you have not heard from us regarding the results in 2 weeks, please contact this office.      Health Maintenance, Male A healthy lifestyle and preventive care is important for your health and wellness. Ask your health care provider about what schedule of regular examinations is right for you. What should I know about weight and diet? Eat a Healthy Diet  Eat plenty of vegetables, fruits, whole grains, low-fat dairy products, and lean protein.  Do not eat a lot of foods high in solid fats, added sugars, or salt.  Maintain a Healthy Weight Regular exercise can help you achieve or maintain a healthy weight. You should:  Do at least 150 minutes of exercise each week. The exercise should increase your heart rate and make you sweat (moderate-intensity exercise).  Do strength-training exercises at least twice a week.  Watch Your Levels of Cholesterol and Blood Lipids  Have your blood tested for lipids and cholesterol every 5 years starting at 44 years of age. If you are at high risk for heart disease, you should start having your blood tested when you are 44 years old. You may need to have your cholesterol levels checked more often if: ? Your lipid or cholesterol levels are high. ? You are older than 44 years of age. ? You  are at high risk for heart disease.  What should I know about cancer screening? Many types of cancers can be detected early and may often be prevented. Lung Cancer  You should be screened every year for lung cancer if: ? You are a current smoker who has smoked for at least 30 years. ? You are a former smoker who has quit within the past 15 years.  Talk to your health care provider about your screening options, when you should start screening, and how often you should be screened.  Colorectal Cancer  Routine colorectal cancer screening usually begins at 44 years of age and should be repeated every 5-10 years until you are 44 years old. You may need to be screened more often if early forms of precancerous polyps or small growths are found. Your health care provider may recommend screening at an earlier age if you have risk factors for colon cancer.  Your health care provider may recommend using home test kits to check for hidden blood in the stool.  A small camera at the end of a tube can be used to examine your colon (sigmoidoscopy or colonoscopy). This checks for the earliest forms of colorectal cancer.  Prostate and Testicular Cancer  Depending on your age and overall health, your health care provider may do certain tests to screen for prostate and testicular cancer.  Talk to your health care provider about any symptoms or concerns you have about testicular or prostate cancer.  Skin Cancer  Check your skin   from head to toe regularly.  Tell your health care provider about any new moles or changes in moles, especially if: ? There is a change in a mole's size, shape, or color. ? You have a mole that is larger than a pencil eraser.  Always use sunscreen. Apply sunscreen liberally and repeat throughout the day.  Protect yourself by wearing long sleeves, pants, a wide-brimmed hat, and sunglasses when outside.  What should I know about heart disease, diabetes, and high blood  pressure?  If you are 18-39 years of age, have your blood pressure checked every 3-5 years. If you are 40 years of age or older, have your blood pressure checked every year. You should have your blood pressure measured twice-once when you are at a hospital or clinic, and once when you are not at a hospital or clinic. Record the average of the two measurements. To check your blood pressure when you are not at a hospital or clinic, you can use: ? An automated blood pressure machine at a pharmacy. ? A home blood pressure monitor.  Talk to your health care provider about your target blood pressure.  If you are between 45-79 years old, ask your health care provider if you should take aspirin to prevent heart disease.  Have regular diabetes screenings by checking your fasting blood sugar level. ? If you are at a normal weight and have a low risk for diabetes, have this test once every three years after the age of 45. ? If you are overweight and have a high risk for diabetes, consider being tested at a younger age or more often.  A one-time screening for abdominal aortic aneurysm (AAA) by ultrasound is recommended for men aged 65-75 years who are current or former smokers. What should I know about preventing infection? Hepatitis B If you have a higher risk for hepatitis B, you should be screened for this virus. Talk with your health care provider to find out if you are at risk for hepatitis B infection. Hepatitis C Blood testing is recommended for:  Everyone born from 1945 through 1965.  Anyone with known risk factors for hepatitis C.  Sexually Transmitted Diseases (STDs)  You should be screened each year for STDs including gonorrhea and chlamydia if: ? You are sexually active and are younger than 44 years of age. ? You are older than 44 years of age and your health care provider tells you that you are at risk for this type of infection. ? Your sexual activity has changed since you were last  screened and you are at an increased risk for chlamydia or gonorrhea. Ask your health care provider if you are at risk.  Talk with your health care provider about whether you are at high risk of being infected with HIV. Your health care provider may recommend a prescription medicine to help prevent HIV infection.  What else can I do?  Schedule regular health, dental, and eye exams.  Stay current with your vaccines (immunizations).  Do not use any tobacco products, such as cigarettes, chewing tobacco, and e-cigarettes. If you need help quitting, ask your health care provider.  Limit alcohol intake to no more than 2 drinks per day. One drink equals 12 ounces of beer, 5 ounces of wine, or 1 ounces of hard liquor.  Do not use street drugs.  Do not share needles.  Ask your health care provider for help if you need support or information about quitting drugs.  Tell your health care   provider if you often feel depressed.  Tell your health care provider if you have ever been abused or do not feel safe at home. This information is not intended to replace advice given to you by your health care provider. Make sure you discuss any questions you have with your health care provider. Document Released: 03/08/2008 Document Revised: 05/09/2016 Document Reviewed: 06/14/2015 Elsevier Interactive Patient Education  2018 [employer]vier Inc.  American Heart Association (AHA) Exercise Recommendation  Being physically active is important to prevent heart disease and stroke, the nation's No. 1and No. 5killers. To improve overall cardiovascular health, we suggest at least 150 minutes per week of moderate exercise or 75 minutes per week of vigorous exercise (or a combination of moderate and vigorous activity). Thirty minutes a day, five times a week is an easy goal to remember. You will also experience benefits even if you divide your time into two or three segments of 10 to 15 minutes per day.  For people who would  benefit from lowering their blood pressure or cholesterol, we recommend 40 minutes of aerobic exercise of moderate to vigorous intensity three to four times a week to lower the risk for heart attack and stroke.  Physical activity is anything that makes you move your body and burn calories.  This includes things like climbing stairs or playing sports. Aerobic exercises benefit your heart, and include walking, jogging, swimming or biking. Strength and stretching exercises are best for overall stamina and flexibility.  The simplest, positive change you can make to effectively improve your heart health is to start walking. It's enjoyable, free, easy, social and great exercise. A walking program is flexible and boasts high success rates because people can stick with it. It's easy for walking to become a regular and satisfying part of life.   For Overall Cardiovascular Health:  At least 30 minutes of moderate-intensity aerobic activity at least 5 days per week for a total of 150  OR   At least 25 minutes of vigorous aerobic activity at least 3 days per week for a total of 75 minutes; or a combination of moderate- and vigorous-intensity aerobic activity  AND   Moderate- to high-intensity muscle-strengthening activity at least 2 days per week for additional health benefits.  For Lowering Blood Pressure and Cholesterol  An average 40 minutes of moderate- to vigorous-intensity aerobic activity 3 or 4 times per week  What if I can't make it to the time goal? Something is always better than nothing! And everyone has to start somewhere. Even if you've been sedentary for years, today is the day you can begin to make healthy changes in your life. If you don't think you'll make it for 30 or 40 minutes, set a reachable goal for today. You can work up toward your overall goal by increasing your time as you get stronger. Don't let all-or-nothing thinking rob you of doing what you can every day.   Source:http://www.heart.org    

## 2017-10-01 NOTE — Progress Notes (Signed)
Tyler Atkins 41 y.o.   Chief Complaint  Patient presents with  . Annual Exam  . Establish Care    HISTORY OF PRESENT ILLNESS: This is a 42 y.o. male Here for annual exam; no complaints and no medical concerns.  Smoking:1ppd Drinking:no Sleeping:6-7 hours; snores Work:regular hours Exercise:walks 7-8 miles daily at work Stress:high Nutrition:average  HPI   Prior to Admission medications   Medication Sig Start Date End Date Taking? Authorizing Provider  amoxicillin (AMOXIL) 500 MG capsule TAKE 1 TABLET BY MOUTH EVERY 8 HOURS UNTIL GONE 05/22/17   [provider]  methocarbamol (ROBAXIN) 500 MG tablet Take 1 tablet (500 mg total) by mouth 2 (two) times daily. Patient not taking: Reported on 10/01/2017 06/03/17   Kellie Shropshire, PA-C  predniSONE (DELTASONE) 20 MG tablet Take 3 tabs po daily x 3 days, then 2 tabs po daily x 3 days, then 1 tab po x 3 days, then 1/2 tab po daily x 3 days Patient not taking: Reported on 09/22/2014 07/15/14   Copland, Gwenlyn Found, MD  sertraline (ZOLOFT) 100 MG tablet Take 100 mg by mouth daily. 05/20/17   [provider]    No Known Allergies  There are no active problems to display for this patient.   Past Medical History:  Diagnosis Date  . PTSD (post-traumatic stress disorder)   . UTI (lower urinary tract infection)     Past Surgical History:  Procedure Laterality Date  . CERVICAL DISC ARTHROPLASTY      Social History   Socioeconomic History  . Marital status: Married    Spouse name: Not on file  . Number of children: Not on file  . Years of education: Not on file  . Highest education level: Not on file  Social Needs  . Financial resource strain: Not on file  . Food insecurity - worry: Not on file  . Food insecurity - inability: Not on file  . Transportation needs - medical: Not on file  . Transportation needs - non-medical: Not on file  Occupational History  . Not on file  Tobacco Use  . Smoking status:  Current Every Day Smoker    Packs/day: 1.00    Types: Cigarettes  . Smokeless tobacco: Never Used  Substance and Sexual Activity  . Alcohol use: Yes    Comment: 1-2x/month  . Drug use: No  . Sexual activity: Not on file  Other Topics Concern  . Not on file  Social History Narrative  . Not on file    Family History  Problem Relation Age of Onset  . Cancer Mother   . Heart disease Father      Review of Systems  Constitutional: Negative for chills, fever and weight loss.       Weight gain  HENT: Negative.   Eyes: Negative.   Respiratory: Negative.  Negative for cough, hemoptysis and shortness of breath.   Cardiovascular: Negative.  Negative for chest pain and palpitations.  Gastrointestinal: Negative.  Negative for abdominal pain, blood in stool, diarrhea, nausea and vomiting.  Genitourinary: Negative.  Negative for dysuria and hematuria.  Musculoskeletal: Positive for back pain (chronic) and neck pain.       Chronic feet pain  Skin: Negative.   Neurological: Positive for weakness. Negative for dizziness, sensory change, focal weakness and headaches.  Endo/Heme/Allergies: Negative.   All other systems reviewed and are negative.   Vitals:   10/01/17 0925  BP: 115/74  Pulse: 82  Resp: 16  Temp: 98.5 F (  36.9 C)  SpO2: 97%    Physical Exam  Constitutional: He is oriented to person, place, and time. He appears well-developed and well-nourished.  HENT:  Head: Normocephalic and atraumatic.  Nose: Nose normal.  Mouth/Throat: Oropharynx is clear and moist.  Eyes: Conjunctivae and EOM are normal. Pupils are equal, round, and reactive to light.  Neck: Normal range of motion. No JVD present. No thyromegaly present.  Cardiovascular: Normal rate, regular rhythm, normal heart sounds and intact distal pulses.  Pulmonary/Chest: Effort normal and breath sounds normal.  Abdominal: Soft. Bowel sounds are normal. He exhibits no distension. There is no tenderness.    Musculoskeletal: Normal range of motion.  Lymphadenopathy:    He has no cervical adenopathy.  Neurological: He is alert and oriented to person, place, and time. No sensory deficit. He exhibits normal muscle tone. Coordination normal.  Skin: Skin is warm and dry. Capillary refill takes less than 2 seconds. No rash noted.  Psychiatric: He has a normal mood and affect. His behavior is normal.  Vitals reviewed.    ASSESSMENT & PLAN: Antwon was seen today for annual exam and establish care.  Diagnoses and all orders for this visit:  Routine general medical examination at a health care facility -     CBC with Differential -     Comprehensive metabolic panel -     Hemoglobin A1c -     Lipid panel -     PSA(Must document that pt has been informed of limitations of PSA testing.) -     HIV antibody -     TSH    Patient Instructions       IF you received an x-ray today, you will receive an invoice from [facility]. Please contact [facility] at [phone] with questions or concerns regarding your invoice.   IF you received labwork today, you will receive an invoice from [facility]. Please contact [facility] at [phone] with questions or concerns regarding your invoice.   Our billing staff will not be able to assist you with questions regarding bills from these companies.  You will be contacted with the lab results as soon as they are available. The fastest way to get your results is to activate your My Chart account. Instructions are located on the last page of this paperwork. If you have not heard from [place] regarding the results in 2 weeks, please contact this office.      Health Maintenance, Male A healthy lifestyle and preventive care is important for your health and wellness. Ask your health care provider about what schedule of regular examinations is right for you. What should I know about weight and diet? Eat a Healthy Diet  Eat plenty of vegetables,  fruits, whole grains, low-fat dairy products, and lean protein.  Do not eat a lot of foods high in solid fats, added sugars, or salt.  Maintain a Healthy Weight Regular exercise can help you achieve or maintain a healthy weight. You should:  Do at least 150 minutes of exercise each week. The exercise should increase your heart rate and make you sweat (moderate-intensity exercise).  Do strength-training exercises at least twice a week.  Watch Your Levels of Cholesterol and Blood Lipids  Have your blood tested for lipids and cholesterol every 5 years starting at 44 years of age. If you are at high risk for heart disease, you should start having your blood tested when you are 44 years old. You may need to have your cholesterol levels checked more  often if: ? Your lipid or cholesterol levels are high. ? You are older than 44 years of age. ? You are at high risk for heart disease.  What should I know about cancer screening? Many types of cancers can be detected early and may often be prevented. Lung Cancer  You should be screened every year for lung cancer if: ? You are a current smoker who has smoked for at least 30 years. ? You are a former smoker who has quit within the past 15 years.  Talk to your health care provider about your screening options, when you should start screening, and how often you should be screened.  Colorectal Cancer  Routine colorectal cancer screening usually begins at 44 years of age and should be repeated every 5-10 years until you are 44 years old. You may need to be screened more often if early forms of precancerous polyps or small growths are found. Your health care provider may recommend screening at an earlier age if you have risk factors for colon cancer.  Your health care provider may recommend using home test kits to check for hidden blood in the stool.  A small camera at the end of a tube can be used to examine your colon (sigmoidoscopy or  colonoscopy). This checks for the earliest forms of colorectal cancer.  Prostate and Testicular Cancer  Depending on your age and overall health, your health care provider may do certain tests to screen for prostate and testicular cancer.  Talk to your health care provider about any symptoms or concerns you have about testicular or prostate cancer.  Skin Cancer  Check your skin from head to toe regularly.  Tell your health care provider about any new moles or changes in moles, especially if: ? There is a change in a mole's size, shape, or color. ? You have a mole that is larger than a pencil eraser.  Always use sunscreen. Apply sunscreen liberally and repeat throughout the day.  Protect yourself by wearing long sleeves, pants, a wide-brimmed hat, and sunglasses when outside.  What should I know about heart disease, diabetes, and high blood pressure?  If you are 19-3 years of age, have your blood pressure checked every 3-5 years. If you are 66 years of age or older, have your blood pressure checked every year. You should have your blood pressure measured twice-once when you are at a hospital or clinic, and once when you are not at a hospital or clinic. Record the average of the two measurements. To check your blood pressure when you are not at a hospital or clinic, you can use: ? An automated blood pressure machine at a pharmacy. ? A home blood pressure monitor.  Talk to your health care provider about your target blood pressure.  If you are between 67-75 years old, ask your health care provider if you should take aspirin to prevent heart disease.  Have regular diabetes screenings by checking your fasting blood sugar level. ? If you are at a normal weight and have a low risk for diabetes, have this test once every three years after the age of 3. ? If you are overweight and have a high risk for diabetes, consider being tested at a younger age or more often.  A one-time screening for  abdominal aortic aneurysm (AAA) by ultrasound is recommended for men aged 65-75 years who are current or former smokers. What should I know about preventing infection? Hepatitis B If you have a higher risk  for hepatitis B, you should be screened for this virus. Talk with your health care provider to find out if you are at risk for hepatitis B infection. Hepatitis C Blood testing is recommended for:  Everyone born from [id number] through 1965.  Anyone with known risk factors for hepatitis C.  Sexually Transmitted Diseases (STDs)  You should be screened each year for STDs including gonorrhea and chlamydia if: ? You are sexually active and are younger than 44 years of age. ? You are older than 44 years of age and your health care provider tells you that you are at risk for this type of infection. ? Your sexual activity has changed since you were last screened and you are at an increased risk for chlamydia or gonorrhea. Ask your health care provider if you are at risk.  Talk with your health care provider about whether you are at high risk of being infected with HIV. Your health care provider may recommend a prescription medicine to help prevent HIV infection.  What else can I do?  Schedule regular health, dental, and eye exams.  Stay current with your vaccines (immunizations).  Do not use any tobacco products, such as cigarettes, chewing tobacco, and e-cigarettes. If you need help quitting, ask your health care provider.  Limit alcohol intake to no more than 2 drinks per day. One drink equals 12 ounces of beer, 5 ounces of wine, or 1 ounces of hard liquor.  Do not use street drugs.  Do not share needles.  Ask your health care provider for help if you need support or information about quitting drugs.  Tell your health care provider if you often feel depressed.  Tell your health care provider if you have ever been abused or do not feel safe at home. This information is not intended to  replace advice given to you by your health care provider. Make sure you discuss any questions you have with your health care provider. Document Released: 03/08/2008 Document Revised: 05/09/2016 Document Reviewed: 06/14/2015 Elsevier Interactive Patient Education  2018 [employer]  American Heart Association (AHA) Exercise Recommendation  Being physically active is important to prevent heart disease and stroke, the nation's No. 1and No. 5killers. To improve overall cardiovascular health, we suggest at least 150 minutes per week of moderate exercise or 75 minutes per week of vigorous exercise (or a combination of moderate and vigorous activity). Thirty minutes a day, five times a week is an easy goal to remember. You will also experience benefits even if you divide your time into two or three segments of 10 to 15 minutes per day.  For people who would benefit from lowering their blood pressure or cholesterol, we recommend 40 minutes of aerobic exercise of moderate to vigorous intensity three to four times a week to lower the risk for heart attack and stroke.  Physical activity is anything that makes you move your body and burn calories.  This includes things like climbing stairs or playing sports. Aerobic exercises benefit your heart, and include walking, jogging, swimming or biking. Strength and stretching exercises are best for overall stamina and flexibility.  The simplest, positive change you can make to effectively improve your heart health is to start walking. It's enjoyable, free, easy, social and great exercise. A walking program is flexible and boasts high success rates because people can stick with it. It's easy for walking to become a regular and satisfying part of life.   For Overall Cardiovascular Health:  At least 30 minutes  of moderate-intensity aerobic activity at least 5 days per week for a total of 150  OR   At least 25 minutes of vigorous aerobic activity at least 3 days  per week for a total of 75 minutes; or a combination of moderate- and vigorous-intensity aerobic activity  AND   Moderate- to high-intensity muscle-strengthening activity at least 2 days per week for additional health benefits.  For Lowering Blood Pressure and Cholesterol  An average 40 minutes of moderate- to vigorous-intensity aerobic activity 3 or 4 times per week  What if I can't make it to the time goal? Something is always better than nothing! And everyone has to start somewhere. Even if you've been sedentary for years, today is the day you can begin to make healthy changes in your life. If you don't think you'll make it for 30 or 40 minutes, set a reachable goal for today. You can work up toward your overall goal by increasing your time as you get stronger. Don't let all-or-nothing thinking rob you of doing what you can every day.  Source:[email or web address].[email or web address] Mound, MD [facility]

## 2017-10-02 LAB — CBC WITH DIFFERENTIAL/PLATELET
BASOS ABS: 0 10*3/uL (ref 0.0–0.2)
Basos: 0 %
EOS (ABSOLUTE): 0.2 10*3/uL (ref 0.0–0.4)
Eos: 2 %
Hematocrit: 47.3 % (ref 37.5–51.0)
Hemoglobin: 16.8 g/dL (ref 13.0–17.7)
IMMATURE GRANS (ABS): 0 10*3/uL (ref 0.0–0.1)
Immature Granulocytes: 0 %
LYMPHS ABS: 2.8 10*3/uL (ref 0.7–3.1)
LYMPHS: 31 %
MCH: 30.6 pg (ref 26.6–33.0)
MCHC: 35.5 g/dL (ref 31.5–35.7)
MCV: 86 fL (ref 79–97)
Monocytes Absolute: 0.7 10*3/uL (ref 0.1–0.9)
Monocytes: 8 %
NEUTROS ABS: 5.4 10*3/uL (ref 1.4–7.0)
Neutrophils: 59 %
PLATELETS: 193 10*3/uL (ref 150–379)
RBC: 5.49 x10E6/uL (ref 4.14–5.80)
RDW: 13.6 % (ref 12.3–15.4)
WBC: 9.1 10*3/uL (ref 3.4–10.8)

## 2017-10-02 LAB — LIPID PANEL
CHOLESTEROL TOTAL: 194 mg/dL (ref 100–199)
Chol/HDL Ratio: 5.7 ratio — ABNORMAL HIGH (ref 0.0–5.0)
HDL: 34 mg/dL — ABNORMAL LOW (ref >39)
LDL Calculated: 127 mg/dL — ABNORMAL HIGH (ref 0–99)
TRIGLYCERIDES: 163 mg/dL — AB (ref 0–149)
VLDL Cholesterol Cal: 33 mg/dL (ref 5–40)

## 2017-10-02 LAB — COMPREHENSIVE METABOLIC PANEL
ALK PHOS: 57 IU/L (ref 39–117)
ALT: 38 IU/L (ref 0–44)
AST: 27 IU/L (ref 0–40)
Albumin/Globulin Ratio: 1.9 (ref 1.2–2.2)
Albumin: 4.6 g/dL (ref 3.5–5.5)
BUN/Creatinine Ratio: 15 (ref 9–20)
BUN: 14 mg/dL (ref 6–24)
Bilirubin Total: 0.5 mg/dL (ref 0.0–1.2)
CHLORIDE: 105 mmol/L (ref 96–106)
CO2: 22 mmol/L (ref 20–29)
Calcium: 9.6 mg/dL (ref 8.7–10.2)
Creatinine, Ser: 0.92 mg/dL (ref 0.76–1.27)
GFR calc Af Amer: 117 mL/min/{1.73_m2} (ref >59)
GFR calc non Af Amer: 102 mL/min/{1.73_m2} (ref >59)
GLUCOSE: 107 mg/dL — AB (ref 65–99)
Globulin, Total: 2.4 g/dL (ref 1.5–4.5)
Potassium: 4.5 mmol/L (ref 3.5–5.2)
Sodium: 142 mmol/L (ref 134–144)
Total Protein: 7 g/dL (ref 6.0–8.5)

## 2017-10-02 LAB — HEMOGLOBIN A1C
Est. average glucose Bld gHb Est-mCnc: 126 mg/dL
HEMOGLOBIN A1C: 6 % — AB (ref 4.8–5.6)

## 2017-10-02 LAB — HIV ANTIBODY (ROUTINE TESTING W REFLEX): HIV Screen 4th Generation wRfx: NONREACTIVE

## 2017-10-02 LAB — PSA: Prostate Specific Ag, Serum: 1.1 ng/mL (ref 0.0–4.0)

## 2017-10-02 LAB — TSH: TSH: 1.4 u[IU]/mL (ref 0.450–4.500)

## 2017-10-03 ENCOUNTER — Encounter: Payer: Self-pay | Admitting: Radiology

## 2018-01-05 ENCOUNTER — Emergency Department (HOSPITAL_COMMUNITY): Payer: Managed Care, Other (non HMO)

## 2018-01-05 ENCOUNTER — Other Ambulatory Visit: Payer: Self-pay

## 2018-01-05 ENCOUNTER — Encounter (HOSPITAL_COMMUNITY): Payer: Self-pay | Admitting: Emergency Medicine

## 2018-01-05 ENCOUNTER — Emergency Department (HOSPITAL_COMMUNITY)
Admission: EM | Admit: 2018-01-05 | Discharge: 2018-01-06 | Disposition: A | Discharge status: Home / Self Care | Payer: Managed Care, Other (non HMO) | Attending: Emergency Medicine | Admitting: Emergency Medicine

## 2018-01-05 DIAGNOSIS — R0602 Shortness of breath: Secondary | ICD-10-CM | POA: Diagnosis not present

## 2018-01-05 DIAGNOSIS — J069 Acute upper respiratory infection, unspecified: Secondary | ICD-10-CM | POA: Diagnosis not present

## 2018-01-05 DIAGNOSIS — R05 Cough: Secondary | ICD-10-CM | POA: Insufficient documentation

## 2018-01-05 DIAGNOSIS — R079 Chest pain, unspecified: Secondary | ICD-10-CM | POA: Diagnosis not present

## 2018-01-05 DIAGNOSIS — Z79899 Other long term (current) drug therapy: Secondary | ICD-10-CM | POA: Diagnosis not present

## 2018-01-05 DIAGNOSIS — F17210 Nicotine dependence, cigarettes, uncomplicated: Secondary | ICD-10-CM | POA: Diagnosis not present

## 2018-01-05 DIAGNOSIS — B9789 Other viral agents as the cause of diseases classified elsewhere: Secondary | ICD-10-CM

## 2018-01-05 LAB — CBC
HCT: 46.2 % (ref 39.0–52.0)
Hemoglobin: 16.6 g/dL (ref 13.0–17.0)
MCH: 31.4 pg (ref 26.0–34.0)
MCHC: 35.9 g/dL (ref 30.0–36.0)
MCV: 87.3 fL (ref 78.0–100.0)
PLATELETS: 172 10*3/uL (ref 150–400)
RBC: 5.29 MIL/uL (ref 4.22–5.81)
RDW: 12.9 % (ref 11.5–15.5)
WBC: 8.9 10*3/uL (ref 4.0–10.5)

## 2018-01-05 LAB — I-STAT TROPONIN, ED
TROPONIN I, POC: 0 ng/mL (ref 0.00–0.08)
TROPONIN I, POC: 0 ng/mL (ref 0.00–0.08)

## 2018-01-05 LAB — BASIC METABOLIC PANEL
Anion gap: 13 (ref 5–15)
BUN: 12 mg/dL (ref 6–20)
CALCIUM: 9.6 mg/dL (ref 8.9–10.3)
CHLORIDE: 104 mmol/L (ref 101–111)
CO2: 22 mmol/L (ref 22–32)
Creatinine, Ser: 0.97 mg/dL (ref 0.61–1.24)
GFR calc non Af Amer: >60 mL/min (ref >60)
GLUCOSE: 155 mg/dL — AB (ref 65–99)
Potassium: 4 mmol/L (ref 3.5–5.1)
Sodium: 139 mmol/L (ref 135–145)

## 2018-01-05 LAB — D-DIMER, QUANTITATIVE (NOT AT ARMC): D DIMER QUANT: 0.39 ug{FEU}/mL (ref 0.00–0.50)

## 2018-01-05 MED ORDER — OXYCODONE-ACETAMINOPHEN 5-325 MG PO TABS
2.0000 | ORAL_TABLET | Freq: Once | ORAL | Status: AC
  Administered 2018-01-05: 2 via ORAL
  Filled 2018-01-05: qty 2

## 2018-01-05 NOTE — ED Triage Notes (Signed)
Pt reports having shortness of breath with pain on inspiration for the last day. Pt reports that pain in chest shoots to back on right side.

## 2018-01-05 NOTE — ED Provider Notes (Signed)
[facility]-EMERGENCY DEPT Provider Note   CSN: [id number] Arrival date & time: 01/05/18  1903     History   Chief Complaint Chief Complaint  Patient presents with  . Shortness of Breath  . Chest Pain    HPI Tyler Atkins is a 42 y.o. male with a hx of PTSD presents to the Emergency Department complaining of gradual, persistent, progressively worsening right-sided chest pain worse with inspiration onset 24 hours ago.  Patient reports he has had URI symptoms for 3-5 days including nasal congestion, postnasal drip, sore throat, sneezing and cough.  He reports associated subjective fevers at home.  He has been taking cough and cold medicine without significant relief.  Patient reports that during a sneezing episode Saturday afternoon.  Patient states significant pain in his right shoulder and right anterior chest with movement, palpation, deep breathing, coughing and sneezing.  He reports a 7 out of 10 pain.  Patient denies associated diaphoresis, specific shortness of breath, nausea, vomiting.  He denies history of DVT, hemoptysis, lupus, recent surgeries, periods of immobilization.   The history is provided by the patient and medical records. No language interpreter was used.    Past Medical History:  Diagnosis Date  . PTSD (post-traumatic stress disorder)   . UTI (lower urinary tract infection)     There are no active problems to display for this patient.   Past Surgical History:  Procedure Laterality Date  . CERVICAL DISC ARTHROPLASTY          Home Medications    Prior to Admission medications   Medication Sig Start Date End Date Taking? Authorizing Provider  amoxicillin (AMOXIL) 500 MG capsule TAKE 1 TABLET BY MOUTH EVERY 8 HOURS UNTIL GONE 05/22/17   [provider]  benzonatate (TESSALON) 100 MG capsule Take 1 capsule (100 mg total) by mouth every 8 (eight) hours. 01/06/18   Ryn Peine, Dahlia Client, PA-C  fluticasone (FLONASE) 50 MCG/ACT nasal  spray Place 2 sprays into both nostrils daily. 01/06/18   Joeanthony Seeling, Dahlia Client, PA-C  methocarbamol (ROBAXIN) 500 MG tablet Take 1 tablet (500 mg total) by mouth 2 (two) times daily. 01/06/18   Dare Spillman, Dahlia Client, PA-C  naproxen (NAPROSYN) 500 MG tablet Take 1 tablet (500 mg total) by mouth 2 (two) times daily with a meal. 01/06/18   Mende Biswell, Dahlia Client, PA-C  sertraline (ZOLOFT) 100 MG tablet Take 100 mg by mouth daily. 05/20/17   [provider]    Family History Family History  Problem Relation Age of Onset  . Cancer Mother   . Heart disease Father     Social History Social History   Tobacco Use  . Smoking status: Current Every Day Smoker    Packs/day: 1.00    Types: Cigarettes  . Smokeless tobacco: Never Used  Substance Use Topics  . Alcohol use: Yes    Comment: 1-2x/month  . Drug use: No     Allergies   Patient has no known allergies.   Review of Systems Review of Systems  Constitutional: Positive for fever ( subjective). Negative for appetite change, diaphoresis, fatigue and unexpected weight change.  HENT: Positive for congestion, postnasal drip, rhinorrhea, sinus pressure and sore throat. Negative for mouth sores.   Eyes: Negative for visual disturbance.  Respiratory: Positive for cough and shortness of breath. Negative for chest tightness and wheezing.   Cardiovascular: Positive for chest pain and leg swelling (remote, resolved ).  Gastrointestinal: Negative for abdominal pain, constipation, diarrhea, nausea and vomiting.  Endocrine: Negative for polydipsia,  polyphagia and polyuria.  Genitourinary: Negative for dysuria, frequency, hematuria and urgency.  Musculoskeletal: Negative for back pain and neck stiffness.  Skin: Negative for rash.  Allergic/Immunologic: Negative for immunocompromised state.  Neurological: Negative for syncope, light-headedness and headaches.  Hematological: Does not bruise/bleed easily.  Psychiatric/Behavioral: Negative for  sleep disturbance. The patient is not nervous/anxious.      Physical Exam Updated Vital Signs BP 126/77 (BP Location: Right Arm)   Pulse 70   Temp 98.6 F (37 C) (Oral)   Resp (!) 24   Ht 6\' 3"  (1.905 m)   Wt 136.1 kg (300 lb)   SpO2 94%   BMI 37.50 kg/m   Physical Exam  Constitutional: He appears well-developed and well-nourished. No distress.  Awake, alert, nontoxic appearance  HENT:  Head: Normocephalic and atraumatic.  Mouth/Throat: Oropharynx is clear and moist. No oropharyngeal exudate.  Eyes: Conjunctivae are normal. No scleral icterus.  Neck: Normal range of motion. Neck supple.  Cardiovascular: Normal rate, regular rhythm and intact distal pulses.  Pulmonary/Chest: Effort normal. No tachypnea. No respiratory distress. He has decreased breath sounds in the left lower field. He has no wheezes. He has no rhonchi. He has no rales.  Equal chest expansion Dry Cough TTP along the right upper chest; no deformity, ecchymosis, crepitus or flail segment  Abdominal: Soft. Bowel sounds are normal. He exhibits no mass. There is no tenderness. There is no rebound, no guarding, no CVA tenderness and negative Murphy's sign.  Soft and nontender.  Negative Murphy sign.  No CVA tenderness.  Musculoskeletal: Normal range of motion. He exhibits no edema.  No peripheral edema; some varicose veins noted in the left lower calf. No tenderness to palpation to the bilateral calf. Full range of motion of right shoulder, elbow, wrist and hand.   Neurological: He is alert.  Speech is clear and goal oriented Moves extremities without ataxia Sensation intact to normal touch in the bilateral upper extremities. Strong and equal grip strength in bilateral upper extremities  Skin: Skin is warm and dry. He is not diaphoretic.  Psychiatric: He has a normal mood and affect.  Nursing note and vitals reviewed.    ED Treatments / Results  Labs (all labs ordered are listed, but only abnormal results  are displayed) Labs Reviewed  BASIC METABOLIC PANEL - Abnormal; Notable for the following components:      Result Value   Glucose, Bld 155 (*)    All other components within normal limits  CBC  HEPATIC FUNCTION PANEL  D-DIMER, QUANTITATIVE (NOT AT [facility])  BRAIN NATRIURETIC PEPTIDE  LIPASE, BLOOD  I-STAT TROPONIN, ED  I-STAT TROPONIN, ED    EKG EKG Interpretation  Date/Time:  Sunday January 05 2018 22:44:05 EDT Ventricular Rate:  71 PR Interval:    QRS Duration: 79 QT Interval:  367 QTC Calculation: 399 R Axis:   43 Text Interpretation:  Sinus rhythm No significant change since last tracing Confirmed by Shaune Pollack [phone]) on 01/05/2018 11:07:42 PM   Radiology Dg Chest 2 View  Result Date: 01/05/2018 CLINICAL DATA:  Flu like symptoms, fever, cough EXAM: CHEST - 2 VIEW COMPARISON:  None. FINDINGS: Lungs are clear.  No pleural effusion or pneumothorax. The heart is normal in size. Visualized osseous structures are within normal limits. IMPRESSION: Normal chest radiographs. Electronically Signed   By: Charline Bills M.D.   On: 01/05/2018 19:54    Procedures Procedures (including critical care time)  Medications Ordered in ED Medications  oxyCODONE-acetaminophen (PERCOCET/ROXICET) 5-325 MG per  tablet 2 tablet (2 tablets Oral Given 01/05/18 2348)     Initial Impression / Assessment and Plan / ED Course  I have reviewed the triage vital signs and the nursing notes.  Pertinent labs & imaging results that were available during my care of the patient were reviewed by me and considered in my medical decision making (see chart for details).     Emergency department with right-sided chest pain and back pain after sneezing episode 24 hours ago.  Labs are reassuring.  No leukocytosis and patient is afebrile.  Doubt pneumonia.  Chest x-ray is without evidence of pneumonia, pulmonary edema or pneumothorax.  I personally evaluated these images.  Lipase is normal and hepatic function  panel is normal without elevation in AST or ALT.  No right upper quadrant abdominal pain.  Less likely to be cholelithiasis or cholecystitis.  D-dimer is negative and patient is low risk.  Doubt PE.  Initial and repeat troponin are negative.  No evidence of acute coronary syndrome.  EKG is nonischemic.  BNP is negative.  No peripheral edema.  No evidence of heart failure.  Patient initially presents with some hypertension however this has improved with his pain.  No hypoxia.  Suspect chest wall pain.  Will give medications for cough, anti-inflammatories and muscle relaxers.  Discussed reasons to return immediately to the emergency department including worsening pain, shortness of breath, sweating and other concerns.  Patient and family state understanding and are in agreement with the plan.  Final Clinical Impressions(s) / ED Diagnoses   Final diagnoses:  Right-sided chest pain  Viral URI with cough    ED Discharge Orders        Ordered    benzonatate (TESSALON) 100 MG capsule  Every 8 hours     01/06/18 0035    methocarbamol (ROBAXIN) 500 MG tablet  2 times daily     01/06/18 0035    naproxen (NAPROSYN) 500 MG tablet  2 times daily with meals     01/06/18 0035    fluticasone (FLONASE) 50 MCG/ACT nasal spray  Daily     01/06/18 0035       Liberty Stead, Dahlia ClientHannah, PA-C 01/06/18 Antonieta Loveless0038    Isaacs, Cameron, MD 01/06/18 [phone]

## 2018-01-06 LAB — HEPATIC FUNCTION PANEL
ALBUMIN: 4.1 g/dL (ref 3.5–5.0)
ALK PHOS: 48 U/L (ref 38–126)
ALT: 40 U/L (ref 17–63)
AST: 25 U/L (ref 15–41)
BILIRUBIN DIRECT: 0.1 mg/dL (ref 0.1–0.5)
BILIRUBIN TOTAL: 0.4 mg/dL (ref 0.3–1.2)
Indirect Bilirubin: 0.3 mg/dL (ref 0.3–0.9)
Total Protein: 7.2 g/dL (ref 6.5–8.1)

## 2018-01-06 LAB — LIPASE, BLOOD: Lipase: 36 U/L (ref 11–51)

## 2018-01-06 LAB — BRAIN NATRIURETIC PEPTIDE: B Natriuretic Peptide: 31.4 pg/mL (ref 0.0–100.0)

## 2018-01-06 MED ORDER — FLUTICASONE PROPIONATE 50 MCG/ACT NA SUSP: 2.0000 | Freq: Every day | NASAL | 2 refills | Status: DC

## 2018-01-06 MED ORDER — BENZONATATE 100 MG PO CAPS: 100.0000 mg | ORAL_CAPSULE | Freq: Three times a day (TID) | ORAL | 0 refills | Status: DC

## 2018-01-06 MED ORDER — NAPROXEN 500 MG PO TABS: 500.0000 mg | ORAL_TABLET | Freq: Two times a day (BID) | ORAL | 0 refills | Status: DC

## 2018-01-06 MED ORDER — METHOCARBAMOL 500 MG PO TABS: 500.0000 mg | ORAL_TABLET | Freq: Two times a day (BID) | ORAL | 0 refills | Status: DC

## 2018-01-06 NOTE — Discharge Instructions (Addendum)
1. Medications: flonase, tessalon, naprosyn, robaxin, usual home medications 2. Treatment: rest, drink plenty of fluids, take tylenol or ibuprofen for fever control 3. Follow Up: Please followup with your primary doctor in 2-3 days for discussion of your diagnoses and further evaluation after today's visit; if you do not have a primary care doctor use the resource guide provided to find one; Return to the ER for high fevers, difficulty breathing, worsening pain or other concerning symptoms

## 2018-11-02 ENCOUNTER — Other Ambulatory Visit: Payer: Self-pay

## 2018-11-02 ENCOUNTER — Emergency Department (HOSPITAL_COMMUNITY): Payer: Managed Care, Other (non HMO)

## 2018-11-02 ENCOUNTER — Encounter (HOSPITAL_COMMUNITY): Payer: Self-pay

## 2018-11-02 DIAGNOSIS — F17210 Nicotine dependence, cigarettes, uncomplicated: Secondary | ICD-10-CM | POA: Diagnosis not present

## 2018-11-02 DIAGNOSIS — J111 Influenza due to unidentified influenza virus with other respiratory manifestations: Secondary | ICD-10-CM | POA: Insufficient documentation

## 2018-11-02 DIAGNOSIS — J40 Bronchitis, not specified as acute or chronic: Secondary | ICD-10-CM | POA: Insufficient documentation

## 2018-11-02 DIAGNOSIS — R509 Fever, unspecified: Secondary | ICD-10-CM | POA: Diagnosis present

## 2018-11-02 MED ORDER — ACETAMINOPHEN 325 MG PO TABS
650.0000 mg | ORAL_TABLET | Freq: Once | ORAL | Status: AC | PRN
  Administered 2018-11-02: 650 mg via ORAL
  Filled 2018-11-02: qty 2

## 2018-11-02 NOTE — ED Triage Notes (Signed)
Pt reports dry cough, fever, generalized body aches, and chills since Thursday. States that he has no appetite. Has been taking dayquil/nyquil without relief. A&Ox4. Ambulatory.

## 2018-11-03 ENCOUNTER — Emergency Department (HOSPITAL_COMMUNITY)
Admission: EM | Admit: 2018-11-03 | Discharge: 2018-11-03 | Disposition: A | Discharge status: Home / Self Care | Payer: Managed Care, Other (non HMO) | Attending: Emergency Medicine | Admitting: Emergency Medicine

## 2018-11-03 DIAGNOSIS — J40 Bronchitis, not specified as acute or chronic: Secondary | ICD-10-CM

## 2018-11-03 DIAGNOSIS — J111 Influenza due to unidentified influenza virus with other respiratory manifestations: Secondary | ICD-10-CM

## 2018-11-03 MED ORDER — ALBUTEROL SULFATE HFA 108 (90 BASE) MCG/ACT IN AERS: 2.0000 | INHALATION_SPRAY | RESPIRATORY_TRACT | 0 refills | Status: DC | PRN

## 2018-11-03 MED ORDER — ALBUTEROL SULFATE (2.5 MG/3ML) 0.083% IN NEBU
5.0000 mg | INHALATION_SOLUTION | Freq: Once | RESPIRATORY_TRACT | Status: AC
Start: 2018-11-03 — End: 2018-11-03
  Administered 2018-11-03: 5 mg via RESPIRATORY_TRACT
  Filled 2018-11-03: qty 6

## 2018-11-03 MED ORDER — PROMETHAZINE-DM 6.25-15 MG/5ML PO SYRP: 5.0000 mL | ORAL_SOLUTION | Freq: Four times a day (QID) | ORAL | 0 refills | Status: DC | PRN

## 2018-11-03 MED ORDER — IBUPROFEN 200 MG PO TABS
600.0000 mg | ORAL_TABLET | Freq: Once | ORAL | Status: AC
  Administered 2018-11-03: 600 mg via ORAL
  Filled 2018-11-03: qty 3

## 2018-11-03 MED ORDER — AEROCHAMBER MV MISC: 2 refills | Status: DC

## 2018-11-03 MED ORDER — IPRATROPIUM BROMIDE 0.02 % IN SOLN
0.5000 mg | Freq: Once | RESPIRATORY_TRACT | Status: AC
  Administered 2018-11-03: 0.5 mg via RESPIRATORY_TRACT
  Filled 2018-11-03: qty 2.5

## 2018-11-03 NOTE — ED Provider Notes (Signed)
[facility]-EMERGENCY DEPT Provider Note   CSN: [id number] Arrival date & time: 11/02/18  2227     History   Chief Complaint Chief Complaint  Patient presents with  . Chills  . Fever  . Cough    HPI Tyler Atkins is a 43 y.o. male with a history of PTSD who presents to the emergency department with a chief complaint of fever.  The patient endorses fever and chills for the last 3 days.  He has been treating his symptoms at home with 400 mg of ibuprofen every 4 hours.  He reports associated nonproductive cough, body aches, headache, shortness of breath, and pain between his bilateral shoulder blades.  He denies chest pain, palpitations, leg swelling, sore throat, otalgia, neck pain or stiffness, abdominal pain, nausea, vomiting, diarrhea.  He reports his appetite is been decreased since fever began 3 days ago.  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  He reports a sick contact earlier last week he was ill with similar symptoms.  No recent travel outside the country.  He is a current, every day 1 pack/day smoker.  No daily medications.  He did not receive a flu shot this year.  The history is provided by the patient. No language interpreter was used.    Past Medical History:  Diagnosis Date  . PTSD (post-traumatic stress disorder)   . UTI (lower urinary tract infection)     There are no active problems to display for this patient.   Past Surgical History:  Procedure Laterality Date  . CERVICAL DISC ARTHROPLASTY          Home Medications    Prior to Admission medications   Medication Sig Start Date End Date Taking? Authorizing Provider  ibuprofen (ADVIL,MOTRIN) 200 MG tablet Take 400 mg by mouth every 6 (six) hours as needed for moderate pain.   Yes [provider]  Pseudoeph-CPM-DM-APAP (ALKA-SELTZER PLUS-D SINUS/COLD) 30-2-10-325 MG CAPS Take 2 capsules by mouth every 6 (six) hours as needed.   Yes [provider]  albuterol (PROVENTIL HFA;VENTOLIN HFA) 108 (90 Base) MCG/ACT inhaler Inhale 2 puffs into the lungs every 4 (four) hours as needed for wheezing or shortness of breath. 11/03/18   Ghassan Coggeshall A, PA-C  promethazine-dextromethorphan (PROMETHAZINE-DM) 6.25-15 MG/5ML syrup Take 5 mLs by mouth 4 (four) times daily as needed for cough. 11/03/18   Wilferd Ritson A, PA-C  Spacer/Aero-Holding Chambers (AEROCHAMBER MV) inhaler Use as instructed 11/03/18   Dmarcus Decicco A, PA-C    Family History Family History  Problem Relation Age of Onset  . Cancer Mother   . Heart disease Father     Social History Social History   Tobacco Use  . Smoking status: Current Every Day Smoker    Packs/day: 1.00    Types: Cigarettes  . Smokeless tobacco: Never Used  Substance Use Topics  . Alcohol use: Yes    Comment: 1-2x/month  . Drug use: No     Allergies   Patient has no known allergies.   Review of Systems Review of Systems  Constitutional: Positive for appetite change, chills and fever.  HENT: Negative for congestion.   Respiratory: Positive for cough and shortness of breath.   Cardiovascular: Negative for chest pain, palpitations and leg swelling.  Gastrointestinal: Negative for abdominal pain, diarrhea, nausea and vomiting.  Genitourinary: Negative for dysuria.  Musculoskeletal: Positive for back pain and myalgias. Negative for arthralgias, neck pain and neck stiffness.  Skin: Negative for  rash.  Allergic/Immunologic: Negative for immunocompromised state.  Neurological: Positive for headaches. Negative for dizziness and weakness.  Psychiatric/Behavioral: Negative for confusion.   Physical Exam Updated Vital Signs BP 124/75 (BP Location: Left Arm)   Pulse 85   Temp 99.6 F (37.6 C) (Oral)   Resp 16   Ht 6\' 3"  (1.905 m)   Wt 131.1 kg   SpO2 96%   BMI 36.12 kg/m   Physical Exam Vitals signs and nursing note reviewed.  Constitutional:      Appearance: He is  well-developed. He is obese. He is not ill-appearing.     Comments: Skin is warm to the touch.   HENT:     Head: Normocephalic.     Right Ear: Hearing, tympanic membrane and ear canal normal.     Left Ear: Hearing, tympanic membrane and ear canal normal.     Mouth/Throat:     Pharynx: Oropharynx is clear. Uvula midline. No oropharyngeal exudate.     Tonsils: No tonsillar exudate.  Eyes:     General: Lids are normal.     Extraocular Movements: Extraocular movements intact.     Conjunctiva/sclera:     Right eye: Right conjunctiva is injected. No chemosis, exudate or hemorrhage.    Left eye: Left conjunctiva is injected. No chemosis, exudate or hemorrhage. Neck:     Musculoskeletal: Neck supple.  Cardiovascular:     Rate and Rhythm: Normal rate and regular rhythm.     Heart sounds: No murmur. No friction rub. No gallop.   Pulmonary:     Effort: Pulmonary effort is normal. No respiratory distress.     Breath sounds: Wheezing present. No rales.     Comments: Wheezing noted in the right upper lung field.  Lungs are otherwise clear to auscultation bilaterally.  Good effort with inspiration. Abdominal:     General: There is no distension.     Palpations: Abdomen is soft.  Skin:    General: Skin is warm and dry.  Neurological:     Mental Status: He is alert.  Psychiatric:        Behavior: Behavior normal.    ED Treatments / Results  Labs (all labs ordered are listed, but only abnormal results are displayed) Labs Reviewed - No data to display  EKG None  Radiology Dg Chest 2 View  Result Date: 11/02/2018 CLINICAL DATA:  Smoker, cough, shortness of breath, chest pain and BILATERAL flank/lower chest pain, smoker EXAM: CHEST - 2 VIEW COMPARISON:  01/05/2018 FINDINGS: Normal heart size, mediastinal contours and pulmonary vascularity. Minimal peribronchial thickening and accentuation of perihilar markings. No definite infiltrate, pleural effusion or pneumothorax. Bones unremarkable.  IMPRESSION: Minimal bronchitic changes without acute infiltrate. Electronically Signed   By: Ulyses Southward M.D.   On: 11/02/2018 23:39    Procedures Procedures (including critical care time)  Medications Ordered in ED Medications  acetaminophen (TYLENOL) tablet 650 mg (650 mg Oral Given 11/02/18 2303)  ibuprofen (ADVIL,MOTRIN) tablet 600 mg (600 mg Oral Given 11/03/18 0503)  albuterol (PROVENTIL) (2.5 MG/3ML) 0.083% nebulizer solution 5 mg (5 mg Nebulization Given 11/03/18 0544)  ipratropium (ATROVENT) nebulizer solution 0.5 mg (0.5 mg Nebulization Given 11/03/18 0544)     Initial Impression / Assessment and Plan / ED Course  I have reviewed the triage vital signs and the nursing notes.  Pertinent labs & imaging results that were available during my care of the patient were reviewed by me and considered in my medical decision making (see chart for details).  43 year old male with PTSD presenting with fever for 3 days accompanied by shortness of breath, nonproductive cough, back pain, headache, and body aches.  Febrile to 100.8 on arrival.  He was given 650 mg of Tylenol recheck his fever was 101.9 6 hours later.  Ibuprofen given.  On exam, his wheezing in the right upper lung.  Albuterol nebulizer treatment given.  On reevaluation, he reports improvement with shortness of breath.  He is feeling much better and is ready for discharge.  He was ambulated on pulse ox in the ER without hypoxia.  Chest x-ray consistent with bronchitis.  No pneumonia.  I suspect his symptoms are secondary to influenza as he is also had sick contacts.  He is outside of the window for Tamiflu.  Will treat symptomatically with albuterol, spacer, and promethazine DM as well as antipyretics.  He is overall well-appearing.  Strict return precautions given.  He is hemodynamically stable and in no acute distress.  He is safe for discharge home with outpatient follow-up at this time.  Final Clinical Impressions(s) / ED Diagnoses    Final diagnoses:  Influenza  Bronchitis    ED Discharge Orders         Ordered    promethazine-dextromethorphan (PROMETHAZINE-DM) 6.25-15 MG/5ML syrup  4 times daily PRN     11/03/18 0647    albuterol (PROVENTIL HFA;VENTOLIN HFA) 108 (90 Base) MCG/ACT inhaler  Every 4 hours PRN     11/03/18 0647    Spacer/Aero-Holding Chambers (AEROCHAMBER MV) inhaler     11/03/18 0647           Barkley BoardsMcDonald, Verdean Murin A, PA-C 11/03/18 0757    Dione BoozeGlick, David, MD 11/03/18 2239

## 2018-11-03 NOTE — Discharge Instructions (Signed)
Thank you for allowing me to care for you today in the Emergency Department.   Your symptoms today are consistent with influenza.  It is important to treat your fever at home.  You can take 650 mg of Tylenol or 600 mg of ibuprofen with food once every 6 hours or alternate between these 2 medications every 3 hours to improve your fever.  For shortness of breath, use 2 puffs of the albuterol inhaler with a spacer every 4 hours as needed.  You can take 5 mL's of Promethazine DM to help with cough and nasal congestion.  Over-the-counter eyedrops can help with irritation of the eyes until your symptoms improve.  It is important to cover your mouth when you cough and wash your hands with warm water and soap to prevent spread of infection.  You can return to work when you have been free from a fever, temperature of greater than 100.5, for more than 24 hours.  Follow-up with primary care for recheck of your symptoms if they do not start to improve in the next 3 to 4 days.  We do not have a primary care provider he can call the number on your discharge paperwork to get established with 1.  Return to the emergency department if you develop neck stiffness, high fever that does not improve with Tylenol and ibuprofen dosing as above, respiratory distress, persistent vomiting, or other new, concerning symptoms.

## 2019-07-07 ENCOUNTER — Emergency Department (HOSPITAL_COMMUNITY)
Admission: EM | Admit: 2019-07-07 | Discharge: 2019-07-07 | Discharge status: Against Medical Advice | Payer: Managed Care, Other (non HMO) | Attending: Emergency Medicine | Admitting: Emergency Medicine

## 2019-07-07 ENCOUNTER — Other Ambulatory Visit: Payer: Self-pay

## 2019-07-07 ENCOUNTER — Emergency Department (HOSPITAL_COMMUNITY): Payer: Managed Care, Other (non HMO)

## 2019-07-07 ENCOUNTER — Encounter (HOSPITAL_COMMUNITY): Payer: Self-pay | Admitting: Emergency Medicine

## 2019-07-07 DIAGNOSIS — R079 Chest pain, unspecified: Secondary | ICD-10-CM | POA: Insufficient documentation

## 2019-07-07 LAB — CBC
HCT: 49 % (ref 39.0–52.0)
Hemoglobin: 17 g/dL (ref 13.0–17.0)
MCH: 30.8 pg (ref 26.0–34.0)
MCHC: 34.7 g/dL (ref 30.0–36.0)
MCV: 88.8 fL (ref 80.0–100.0)
Platelets: 202 10*3/uL (ref 150–400)
RBC: 5.52 MIL/uL (ref 4.22–5.81)
RDW: 12.5 % (ref 11.5–15.5)
WBC: 10.8 10*3/uL — ABNORMAL HIGH (ref 4.0–10.5)
nRBC: 0 % (ref 0.0–0.2)

## 2019-07-07 LAB — BASIC METABOLIC PANEL
Anion gap: 11 (ref 5–15)
BUN: 12 mg/dL (ref 6–20)
CO2: 23 mmol/L (ref 22–32)
Calcium: 9.6 mg/dL (ref 8.9–10.3)
Chloride: 103 mmol/L (ref 98–111)
Creatinine, Ser: 1.1 mg/dL (ref 0.61–1.24)
GFR calc Af Amer: >60 mL/min (ref >60)
GFR calc non Af Amer: >60 mL/min (ref >60)
Glucose, Bld: 110 mg/dL — ABNORMAL HIGH (ref 70–99)
Potassium: 4.9 mmol/L (ref 3.5–5.1)
Sodium: 137 mmol/L (ref 135–145)

## 2019-07-07 LAB — TROPONIN I (HIGH SENSITIVITY): Troponin I (High Sensitivity): 3 ng/L (ref <18)

## 2019-07-07 MED ORDER — SODIUM CHLORIDE 0.9% FLUSH: 3.0000 mL | Freq: Once | INTRAVENOUS | Status: DC

## 2019-07-07 NOTE — ED Triage Notes (Signed)
Pt arrives via EMS from workplace where patient developed left arm pain, then substernal CP, went to an UC facility to be seen. EKG SR, unremarkable. No cardiac hx. BP WNL. 18g LAC, 324mg  ASA, 1 SL nitro. CP from 6/10 to 4/10.

## 2019-11-17 ENCOUNTER — Ambulatory Visit (INDEPENDENT_AMBULATORY_CARE_PROVIDER_SITE_OTHER): Payer: Managed Care, Other (non HMO) | Admitting: Emergency Medicine

## 2019-11-17 ENCOUNTER — Other Ambulatory Visit: Payer: Self-pay

## 2019-11-17 ENCOUNTER — Encounter: Payer: Self-pay | Admitting: Emergency Medicine

## 2019-11-17 VITALS — BP 107/74 | HR 92 | Temp 97.4°F | Resp 16 | Ht 74.0 in | Wt 301.0 lb

## 2019-11-17 DIAGNOSIS — M549 Dorsalgia, unspecified: Secondary | ICD-10-CM

## 2019-11-17 DIAGNOSIS — M545 Low back pain, unspecified: Secondary | ICD-10-CM

## 2019-11-17 DIAGNOSIS — M62838 Other muscle spasm: Secondary | ICD-10-CM

## 2019-11-17 DIAGNOSIS — M5431 Sciatica, right side: Secondary | ICD-10-CM

## 2019-11-17 DIAGNOSIS — M79604 Pain in right leg: Secondary | ICD-10-CM

## 2019-11-17 MED ORDER — CYCLOBENZAPRINE HCL 10 MG PO TABS: 10.0000 mg | ORAL_TABLET | Freq: Every day | ORAL | 0 refills | Status: DC

## 2019-11-17 MED ORDER — KETOROLAC TROMETHAMINE 60 MG/2ML IM SOLN
60.0000 mg | Freq: Once | INTRAMUSCULAR | Status: AC
  Administered 2019-11-17: 12:00:00 60 mg via INTRAMUSCULAR

## 2019-11-17 MED ORDER — TRAMADOL HCL 50 MG PO TABS: 50.0000 mg | ORAL_TABLET | Freq: Three times a day (TID) | ORAL | 0 refills | Status: AC | PRN

## 2019-11-17 MED ORDER — METHYLPREDNISOLONE 4 MG PO TBPK: ORAL_TABLET | ORAL | 1 refills | Status: DC

## 2019-11-17 NOTE — Patient Instructions (Addendum)
   If you have lab work done today you will be contacted with your lab results within the next 2 weeks.  If you have not heard from us then please contact us. The fastest way to get your results is to register for My Chart.   IF you received an x-ray today, you will receive an invoice from [facility]. Please contact [facility] at [phone] with questions or concerns regarding your invoice.   IF you received labwork today, you will receive an invoice from [facility]. Please contact [facility] at [phone] with questions or concerns regarding your invoice.   Our billing staff will not be able to assist you with questions regarding bills from these companies.  You will be contacted with the lab results as soon as they are available. The fastest way to get your results is to activate your My Chart account. Instructions are located on the last page of this paperwork. If you have not heard from us regarding the results in 2 weeks, please contact this office.     Sciatica  Sciatica is pain, weakness, tingling, or loss of feeling (numbness) along the sciatic nerve. The sciatic nerve starts in the lower back and goes down the back of each leg. Sciatica usually goes away on its own or with treatment. Sometimes, sciatica may come back (recur). What are the causes? This condition happens when the sciatic nerve is pinched or has pressure put on it. This may be the result of:  A disk in between the bones of the spine bulging out too far (herniated disk).  Changes in the spinal disks that occur with aging.  A condition that affects a muscle in the butt.  Extra bone growth near the sciatic nerve.  A break (fracture) of the area between your hip bones (pelvis).  Pregnancy.  Tumor. This is rare. What increases the risk? You are more likely to develop this condition if you:  Play sports that put pressure or stress on the spine.  Have poor strength and ease of movement  (flexibility).  Have had a back injury in the past.  Have had back surgery.  Sit for long periods of time.  Do activities that involve bending or lifting over and over again.  Are very overweight (obese). What are the signs or symptoms? Symptoms can vary from mild to very bad. They may include:  Any of these problems in the lower back, leg, hip, or butt: ? Mild tingling, loss of feeling, or dull aches. ? Burning sensations. ? Sharp pains.  Loss of feeling in the back of the calf or the sole of the foot.  Leg weakness.  Very bad back pain that makes it hard to move. These symptoms may get worse when you cough, sneeze, or laugh. They may also get worse when you sit or stand for long periods of time. How is this treated? This condition often gets better without any treatment. However, treatment may include:  Changing or cutting back on physical activity when you have pain.  Doing exercises and stretching.  Putting ice or heat on the affected area.  Medicines that help: ? To relieve pain and swelling. ? To relax your muscles.  Shots (injections) of medicines that help to relieve pain, irritation, and swelling.  Surgery. Follow these instructions at home: Medicines  Take over-the-counter and prescription medicines only as told by your doctor.  Ask your doctor if the medicine prescribed to you: ? Requires you to avoid driving or using   heavy machinery. ? Can cause trouble pooping (constipation). You may need to take these steps to prevent or treat trouble pooping:  Drink enough fluids to keep your pee (urine) pale yellow.  Take over-the-counter or prescription medicines.  Eat foods that are high in fiber. These include beans, whole grains, and fresh fruits and vegetables.  Limit foods that are high in fat and sugar. These include fried or sweet foods. Managing pain      If told, put ice on the affected area. ? Put ice in a plastic bag. ? Place a towel between  your skin and the bag. ? Leave the ice on for 20 minutes, 2-3 times a day.  If told, put heat on the affected area. Use the heat source that your doctor tells you to use, such as a moist heat pack or a heating pad. ? Place a towel between your skin and the heat source. ? Leave the heat on for 20-30 minutes. ? Remove the heat if your skin turns bright red. This is very important if you are unable to feel pain, heat, or cold. You may have a greater risk of getting burned. Activity   Return to your normal activities as told by your doctor. Ask your doctor what activities are safe for you.  Avoid activities that make your symptoms worse.  Take short rests during the day. ? When you rest for a long time, do some physical activity or stretching between periods of rest. ? Avoid sitting for a long time without moving. Get up and move around at least one time each hour.  Exercise and stretch regularly, as told by your doctor.  Do not lift anything that is heavier than 10 lb (4.5 kg) while you have symptoms of sciatica. ? Avoid lifting heavy things even when you do not have symptoms. ? Avoid lifting heavy things over and over.  When you lift objects, always lift in a way that is safe for your body. To do this, you should: ? Bend your knees. ? Keep the object close to your body. ? Avoid twisting. General instructions  Stay at a healthy weight.  Wear comfortable shoes that support your feet. Avoid wearing high heels.  Avoid sleeping on a mattress that is too soft or too hard. You might have less pain if you sleep on a mattress that is firm enough to support your back.  Keep all follow-up visits as told by your doctor. This is important. Contact a doctor if:  You have pain that: ? Wakes you up when you are sleeping. ? Gets worse when you lie down. ? Is worse than the pain you have had in the past. ? Lasts longer than 4 weeks.  You lose weight without trying. Get help right away  if:  You cannot control when you pee (urinate) or poop (have a bowel movement).  You have weakness in any of these areas and it gets worse: ? Lower back. ? The area between your hip bones. ? Butt. ? Legs.  You have redness or swelling of your back.  You have a burning feeling when you pee. Summary  Sciatica is pain, weakness, tingling, or loss of feeling (numbness) along the sciatic nerve.  This condition happens when the sciatic nerve is pinched or has pressure put on it.  Sciatica can cause pain, tingling, or loss of feeling (numbness) in the lower back, legs, hips, and butt.  Treatment often includes rest, exercise, medicines, and putting ice or heat   on the affected area. This information is not intended to replace advice given to you by your health care provider. Make sure you discuss any questions you have with your health care provider. Document Revised: 09/29/2018 Document Reviewed: 09/29/2018 [employer] Patient Education  2020 [employer]  

## 2019-11-17 NOTE — Progress Notes (Signed)
Tyler Atkins 44 y.o.   Chief Complaint  Patient presents with  . Back Pain    per patient radiates to RIGHT leg since Saturday    HISTORY OF PRESENT ILLNESS: This is a 44 y.o. male complaining of severe constant sharp pain to right lumbar area radiating to right buttock and down the back of his right leg that started 4 days ago and has been progressively getting worse.  No other associated symptoms.  Denies bowel or or bladder symptoms.  Denies injuries but he has very physical workload. Has had similar episodes in the past and has seen the orthopedist several times. MRI of lumbar spine done October 2015, impression as follows: IMPRESSION: 1. 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 2. No significant lumbar spinal stenosis, and other levels are within normal limits for age.  HPI   Prior to Admission medications   Medication Sig Start Date End Date Taking? Authorizing Provider  albuterol (PROVENTIL HFA;VENTOLIN HFA) 108 (90 Base) MCG/ACT inhaler Inhale 2 puffs into the lungs every 4 (four) hours as needed for wheezing or shortness of breath. Patient not taking: Reported on 11/17/2019 11/03/18   McDonald, Pedro Earls A, PA-C  ibuprofen (ADVIL,MOTRIN) 200 MG tablet Take 400 mg by mouth every 6 (six) hours as needed for moderate pain.    [provider]  Spacer/Aero-Holding Chambers (AEROCHAMBER MV) inhaler Use as instructed 11/03/18   McDonald, Mia A, PA-C    No Known Allergies  There are no problems to display for this patient.   Past Medical History:  Diagnosis Date  . PTSD (post-traumatic stress disorder)   . UTI (lower urinary tract infection)     Past Surgical History:  Procedure Laterality Date  . CERVICAL DISC ARTHROPLASTY      Social History   Socioeconomic History  . Marital status: Married    Spouse name: Not on file  . Number of  children: Not on file  . Years of education: Not on file  . Highest education level: Not on file  Occupational History  . Not on file  Tobacco Use  . Smoking status: Current Every Day Smoker    Packs/day: 1.00    Types: Cigarettes  . Smokeless tobacco: Never Used  Substance and Sexual Activity  . Alcohol use: Yes    Comment: 1-2x/month  . Drug use: No  . Sexual activity: Not on file  Other Topics Concern  . Not on file  Social History Narrative  . Not on file   Social Determinants of Health   Financial Resource Strain:   . Difficulty of Paying Living Expenses: Not on file  Food Insecurity:   . Worried About Programme researcher, broadcasting/film/video in the Last Year: Not on file  . Ran Out of Food in the Last Year: Not on file  Transportation Needs:   . Lack of Transportation (Medical): Not on file  . Lack of Transportation (Non-Medical): Not on file  Physical Activity:   . Days of Exercise per Week: Not on file  . Minutes of Exercise per Session: Not on file  Stress:   . Feeling of Stress : Not on file  Social Connections:   . Frequency of Communication with Friends and Family: Not on file  . Frequency of Social Gatherings with Friends and Family: Not on file  . Attends Religious Services: Not on file  . Active Member of Clubs or  Organizations: Not on file  . Attends Banker Meetings: Not on file  . Marital Status: Not on file  Intimate Partner Violence:   . Fear of Current or Ex-Partner: Not on file  . Emotionally Abused: Not on file  . Physically Abused: Not on file  . Sexually Abused: Not on file    Family History  Problem Relation Age of Onset  . Cancer Mother   . Heart disease Father      Review of Systems  Constitutional: Negative.  Negative for chills and fever.  HENT: Negative.   Respiratory: Negative.  Negative for cough and shortness of breath.   Cardiovascular: Negative.  Negative for chest pain and palpitations.  Gastrointestinal: Negative.  Negative  for abdominal pain, blood in stool, diarrhea, melena, nausea and vomiting.  Genitourinary: Negative.  Negative for dysuria, flank pain and hematuria.  Musculoskeletal: Positive for back pain.  Skin: Negative.  Negative for rash.  Neurological: Negative for dizziness, sensory change, speech change, focal weakness and headaches.  All other systems reviewed and are negative.  Today's Vitals   11/17/19 1058  BP: 107/74  Pulse: 92  Resp: 16  Temp: (!) 97.4 F (36.3 C)  TempSrc: Temporal  SpO2: 96%  Weight: (!) 301 lb (136.5 kg)  Height: 6\' 2"  (1.88 m)   Body mass index is 38.65 kg/m.   Physical Exam Vitals reviewed.  Constitutional:      Appearance: Normal appearance.  HENT:     Head: Normocephalic.  Eyes:     Extraocular Movements: Extraocular movements intact.     Pupils: Pupils are equal, round, and reactive to light.  Cardiovascular:     Rate and Rhythm: Normal rate and regular rhythm.     Pulses: Normal pulses.     Heart sounds: Normal heart sounds.  Pulmonary:     Effort: Pulmonary effort is normal.     Breath sounds: Normal breath sounds.  Abdominal:     General: There is no distension.     Palpations: Abdomen is soft.     Tenderness: There is no abdominal tenderness.  Musculoskeletal:     Cervical back: Normal range of motion and neck supple.     Lumbar back: Spasms and tenderness present. No bony tenderness. Decreased range of motion. Positive right straight leg raise test.       Back:  Skin:    General: Skin is warm and dry.     Capillary Refill: Capillary refill takes less than 2 seconds.  Neurological:     General: No focal deficit present.     Mental Status: He is alert and oriented to person, place, and time.     Sensory: No sensory deficit.     Motor: No weakness.     Coordination: Coordination normal.     Gait: Gait normal.  Psychiatric:        Mood and Affect: Mood normal.        Behavior: Behavior normal.    A total of 30 minutes was spent  with the patient, greater than 50% of which was in counseling/coordination of care regarding differential diagnosis of sciatica, treatment, medications and side effects, need for orthopedic evaluation possible repeat MRI, prognosis and need for follow-up.   ASSESSMENT & PLAN: Tyler Atkins was seen today for back pain.  Diagnoses and all orders for this visit:  Sciatica of right side -     ketorolac (TORADOL) injection 60 mg -     methylPREDNISolone (MEDROL DOSEPAK) 4 MG  TBPK tablet; Sig as indicated -     Ambulatory referral to Orthopedic Surgery  Lumbar pain with radiation down right leg -     traMADol (ULTRAM) 50 MG tablet; Take 1 tablet (50 mg total) by mouth every 8 (eight) hours as needed for up to 5 days.  Muscle spasm -     cyclobenzaprine (FLEXERIL) 10 MG tablet; Take 1 tablet (10 mg total) by mouth at bedtime.  Musculoskeletal back pain    Patient Instructions       If you have lab work done today you will be contacted with your lab results within the next 2 weeks.  If you have not heard from [place] then please contact us. The fastest way to get your results is to register for My Chart.   IF you received an x-ray today, you will receive an invoice from [facility]. Please contact [facility] at [phone] with questions or concerns regarding your invoice.   IF you received labwork today, you will receive an invoice from [facility]. Please contact [facility] at [phone] with questions or concerns regarding your invoice.   Our billing staff will not be able to assist you with questions regarding bills from these companies.  You will be contacted with the lab results as soon as they are available. The fastest way to get your results is to activate your My Chart account. Instructions are located on the last page of this paperwork. If you have not heard from [place] regarding the results in 2 weeks, please contact this office.     Sciatica  Sciatica is pain,  weakness, tingling, or loss of feeling (numbness) along the sciatic nerve. The sciatic nerve starts in the lower back and goes down the back of each leg. Sciatica usually goes away on its own or with treatment. Sometimes, sciatica may come back (recur). What are the causes? This condition happens when the sciatic nerve is pinched or has pressure put on it. This may be the result of:  A disk in between the bones of the spine bulging out too far (herniated disk).  Changes in the spinal disks that occur with aging.  A condition that affects a muscle in the butt.  Extra bone growth near the sciatic nerve.  A break (fracture) of the area between your hip bones (pelvis).  Pregnancy.  Tumor. This is rare. What increases the risk? You are more likely to develop this condition if you:  Play sports that put pressure or stress on the spine.  Have poor strength and ease of movement (flexibility).  Have had a back injury in the past.  Have had back surgery.  Sit for long periods of time.  Do activities that involve bending or lifting over and over again.  Are very overweight (obese). What are the signs or symptoms? Symptoms can vary from mild to very bad. They may include:  Any of these problems in the lower back, leg, hip, or butt: ? Mild tingling, loss of feeling, or dull aches. ? Burning sensations. ? Sharp pains.  Loss of feeling in the back of the calf or the sole of the foot.  Leg weakness.  Very bad back pain that makes it hard to move. These symptoms may get worse when you cough, sneeze, or laugh. They may also get worse when you sit or stand for long periods of time. How is this treated? This condition often gets better without any treatment. However, treatment may include:  Changing or cutting back  on physical activity when you have pain.  Doing exercises and stretching.  Putting ice or heat on the affected area.  Medicines that help: ? To relieve pain and  swelling. ? To relax your muscles.  Shots (injections) of medicines that help to relieve pain, irritation, and swelling.  Surgery. Follow these instructions at home: Medicines  Take over-the-counter and prescription medicines only as told by your doctor.  Ask your doctor if the medicine prescribed to you: ? Requires you to avoid driving or using heavy machinery. ? Can cause trouble pooping (constipation). You may need to take these steps to prevent or treat trouble pooping:  Drink enough fluids to keep your pee (urine) pale yellow.  Take over-the-counter or prescription medicines.  Eat foods that are high in fiber. These include beans, whole grains, and fresh fruits and vegetables.  Limit foods that are high in fat and sugar. These include fried or sweet foods. Managing pain      If told, put ice on the affected area. ? Put ice in a plastic bag. ? Place a towel between your skin and the bag. ? Leave the ice on for 20 minutes, 2-3 times a day.  If told, put heat on the affected area. Use the heat source that your doctor tells you to use, such as a moist heat pack or a heating pad. ? Place a towel between your skin and the heat source. ? Leave the heat on for 20-30 minutes. ? Remove the heat if your skin turns bright red. This is very important if you are unable to feel pain, heat, or cold. You may have a greater risk of getting burned. Activity   Return to your normal activities as told by your doctor. Ask your doctor what activities are safe for you.  Avoid activities that make your symptoms worse.  Take short rests during the day. ? When you rest for a long time, do some physical activity or stretching between periods of rest. ? Avoid sitting for a long time without moving. Get up and move around at least one time each hour.  Exercise and stretch regularly, as told by your doctor.  Do not lift anything that is heavier than 10 lb (4.5 kg) while you have symptoms of  sciatica. ? Avoid lifting heavy things even when you do not have symptoms. ? Avoid lifting heavy things over and over.  When you lift objects, always lift in a way that is safe for your body. To do this, you should: ? Bend your knees. ? Keep the object close to your body. ? Avoid twisting. General instructions  Stay at a healthy weight.  Wear comfortable shoes that support your feet. Avoid wearing high heels.  Avoid sleeping on a mattress that is too soft or too hard. You might have less pain if you sleep on a mattress that is firm enough to support your back.  Keep all follow-up visits as told by your doctor. This is important. Contact a doctor if:  You have pain that: ? Wakes you up when you are sleeping. ? Gets worse when you lie down. ? Is worse than the pain you have had in the past. ? Lasts longer than 4 weeks.  You lose weight without trying. Get help right away if:  You cannot control when you pee (urinate) or poop (have a bowel movement).  You have weakness in any of these areas and it gets worse: ? Lower back. ? The area between your  hip bones. ? Butt. ? Legs.  You have redness or swelling of your back.  You have a burning feeling when you pee. Summary  Sciatica is pain, weakness, tingling, or loss of feeling (numbness) along the sciatic nerve.  This condition happens when the sciatic nerve is pinched or has pressure put on it.  Sciatica can cause pain, tingling, or loss of feeling (numbness) in the lower back, legs, hips, and butt.  Treatment often includes rest, exercise, medicines, and putting ice or heat on the affected area. This information is not intended to replace advice given to you by your health care provider. Make sure you discuss any questions you have with your health care provider. Document Revised: 09/29/2018 Document Reviewed: 09/29/2018 Elsevier Patient Education  2020 Elsevier Inc.      Edwina Barth, MD [facility]

## 2019-11-19 ENCOUNTER — Encounter: Payer: Self-pay | Admitting: Family Medicine

## 2019-11-19 ENCOUNTER — Other Ambulatory Visit: Payer: Self-pay

## 2019-11-19 ENCOUNTER — Ambulatory Visit (INDEPENDENT_AMBULATORY_CARE_PROVIDER_SITE_OTHER): Payer: Managed Care, Other (non HMO) | Admitting: Family Medicine

## 2019-11-19 DIAGNOSIS — G8929 Other chronic pain: Secondary | ICD-10-CM

## 2019-11-19 DIAGNOSIS — M5441 Lumbago with sciatica, right side: Secondary | ICD-10-CM

## 2019-11-19 NOTE — Progress Notes (Signed)
Office Visit Note   Patient: Tyler Atkins           Date of Birth: [birth year]           MRN: [id number] Visit Date: 11/19/2019 Requested by: Horald Pollen, MD [place],  [place] [address] PCP: Patient, No Pcp Per  Subjective: Chief Complaint  Patient presents with  . Lower Back - Pain    Pain right side of back and down the right leg. Has had problems with this for years. "I know I have a bulging disc." The pain worsened this past Saturday. The current medication regimen is helping him to walk - less pain.    HPI: He is here with severe right-sided sciatica.  He has had intermittent back problems over the years and had an MRI scan in 2015 showing a right-sided L4-5 disc extrusion severely affecting the course of the right L5 nerve roots.  He has had intermittent flareups which have responded to rest and medications, but about 5 days ago after driving in his car he developed a severe flareup with cramping pain in his right buttocks, posterior hamstring and calf and it was so bad that he could not get up out of the shower for a couple hours.  He went to urgent care and was given an injection which helped, and Medrol dose pack.  His pain is bearable but still severe.  Denies any bowel or bladder dysfunction.  He has developed weakness in his right leg as well as numbness in his foot.  He has been out of work because of his pain.              ROS: No fevers or chills.  All other systems were reviewed and are negative.  Objective: Vital Signs: There were no vitals taken for this visit.  Physical Exam:  General:  Alert and oriented, in no acute distress. Pulm:  Breathing unlabored. Psy:  Normal mood, congruent affect. Skin: No rash. Low back: No significant midline tenderness lumbar spine, no pain over the SI joints.  He is very tender in the right sciatic notch.  Straight leg raise is positive with weakness on ankle eversion and plantar flexion.  Hypoactive knee and ankle DTRs  bilaterally.  Imaging: None today  Assessment & Plan: 1.  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  He has tried physical therapy and chiropractic in the past with no change in symptoms.  We will order MRI lumbar spine.  He will do some home back extension exercises in the meantime, we will keep him out of work for 2 weeks while awaiting test results.     Procedures: No procedures performed  No notes on file     PMFS History: There are no problems to display for this patient.  Past Medical History:  Diagnosis Date  . PTSD (post-traumatic stress disorder)   . UTI (lower urinary tract infection)     Family History  Problem Relation Age of Onset  . Cancer Mother   . Heart disease Father     Past Surgical History:  Procedure Laterality Date  . CERVICAL DISC ARTHROPLASTY     Social History   Occupational History  . Not on file  Tobacco Use  . Smoking status: Current Every Day Smoker    Packs/day: 1.00    Types: Cigarettes  .  Smokeless tobacco: Never Used  Substance and Sexual Activity  . Alcohol use: Yes    Comment: 1-2x/month  . Drug use: No  . Sexual activity: Not on file

## 2019-11-23 ENCOUNTER — Telehealth: Payer: Self-pay | Admitting: General Practice

## 2019-11-23 NOTE — Telephone Encounter (Signed)
Pt calling regarding a refill on the medication proscribed on 11/17/19 traMADol (ULTRAM) 50 MG tablet [[id number]] ENDED methylPREDNISolone (MEDROL DOSEPAK) 4 MG TBPK tablet [[id number]] Pt would like a call [phone]

## 2019-11-24 NOTE — Telephone Encounter (Signed)
Left message to call bk

## 2019-11-24 NOTE — Telephone Encounter (Signed)
Pt returned called, she would like a call back after lunch

## 2019-11-24 NOTE — Telephone Encounter (Signed)
Please Advise

## 2019-11-24 NOTE — Telephone Encounter (Signed)
Patient is calling back regarding this request . Patient is needing something in addition to his flexeril  .   Please advise please call Wife at [phone]

## 2019-12-04 ENCOUNTER — Telehealth: Payer: Self-pay | Admitting: Family Medicine

## 2019-12-04 NOTE — Telephone Encounter (Signed)
Lumbar MRI scan shows pain disc extrusion at L5-S1 toward the right causing compression of the S1 nerve root.  This would be the most likely source of pain.  Treatment options would include surgical consult, or possibly a one-time cortisone injection.

## 2019-12-07 ENCOUNTER — Other Ambulatory Visit: Payer: Self-pay

## 2019-12-07 ENCOUNTER — Ambulatory Visit (INDEPENDENT_AMBULATORY_CARE_PROVIDER_SITE_OTHER): Payer: Managed Care, Other (non HMO) | Admitting: Family Medicine

## 2019-12-07 ENCOUNTER — Encounter: Payer: Self-pay | Admitting: Family Medicine

## 2019-12-07 DIAGNOSIS — M5441 Lumbago with sciatica, right side: Secondary | ICD-10-CM

## 2019-12-07 DIAGNOSIS — G8929 Other chronic pain: Secondary | ICD-10-CM

## 2019-12-07 DIAGNOSIS — M62838 Other muscle spasm: Secondary | ICD-10-CM | POA: Diagnosis not present

## 2019-12-07 MED ORDER — CYCLOBENZAPRINE HCL 10 MG PO TABS: 10.0000 mg | ORAL_TABLET | Freq: Three times a day (TID) | ORAL | 1 refills | Status: DC | PRN

## 2019-12-07 NOTE — Progress Notes (Signed)
   Office Visit Note   Patient: Tyler Atkins           Date of Birth: [birth year]           MRN: [id number] Visit Date: 12/07/2019 Requested by: No referring provider defined for this encounter. PCP: Patient, No Pcp Per  Subjective: Chief Complaint  Patient presents with  . Lower Back - Follow-up    HPI: He is here for follow-up low back and right leg pain.  Still having severe pain with radiation into his calf.  MRI showed right-sided L5-S1 disc protrusion/extrusion compressing the S1 nerve root.              ROS: No bowel or bladder dysfunction.  All other systems were reviewed and are negative.  Objective: Vital Signs: There were no vitals taken for this visit.  Physical Exam:  General:  Alert and oriented, in no acute distress. Pulm:  Breathing unlabored. Psy:  Normal mood, congruent affect.  Low back: He has pain with straight leg raise on the right.  Slight weakness with ankle eversion, remainder of strength is normal.  1+ knee and ankle DTRs.  Imaging: None today  Assessment & Plan: 1.  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 Ophelia Charter.  Appointment scheduled for tomorrow.  Refilled Flexeril.     Procedures: No procedures performed  No notes on file     PMFS History: There are no problems to display for this patient.  Past Medical History:  Diagnosis Date  . PTSD (post-traumatic stress disorder)   . UTI (lower urinary tract infection)     Family History  Problem Relation Age of Onset  . Cancer Mother   . Heart disease Father     Past Surgical History:  Procedure Laterality Date  . CERVICAL DISC ARTHROPLASTY     Social History   Occupational History  . Not on file  Tobacco Use  . Smoking status: Current Every Day Smoker    Packs/day: 1.00    Types: Cigarettes  . Smokeless tobacco: Never Used  Substance and Sexual Activity  .  Alcohol use: Yes    Comment: 1-2x/month  . Drug use: No  . Sexual activity: Not on file

## 2019-12-07 NOTE — Telephone Encounter (Signed)
Can you please call and get him in with Ophelia Charter

## 2019-12-07 NOTE — Telephone Encounter (Signed)
Not sure he can make it that long.  Would it be quicker to see MY?

## 2019-12-07 NOTE — Telephone Encounter (Signed)
JN next available is 12/30/2019-- would that be to far out

## 2019-12-08 ENCOUNTER — Ambulatory Visit (INDEPENDENT_AMBULATORY_CARE_PROVIDER_SITE_OTHER): Payer: Managed Care, Other (non HMO) | Admitting: Orthopaedic Surgery

## 2019-12-08 ENCOUNTER — Ambulatory Visit (INDEPENDENT_AMBULATORY_CARE_PROVIDER_SITE_OTHER): Payer: Managed Care, Other (non HMO)

## 2019-12-08 VITALS — BP 137/90 | HR 85 | Ht 75.0 in | Wt 304.6 lb

## 2019-12-08 DIAGNOSIS — G8929 Other chronic pain: Secondary | ICD-10-CM

## 2019-12-08 DIAGNOSIS — M5441 Lumbago with sciatica, right side: Secondary | ICD-10-CM
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
## 2019-12-10 ENCOUNTER — Other Ambulatory Visit: Payer: Self-pay

## 2019-12-10 ENCOUNTER — Ambulatory Visit (INDEPENDENT_AMBULATORY_CARE_PROVIDER_SITE_OTHER): Payer: Managed Care, Other (non HMO) | Admitting: Surgery

## 2019-12-10 ENCOUNTER — Encounter: Payer: Self-pay | Admitting: Surgery

## 2019-12-10 VITALS — BP 142/95 | HR 96 | Ht 75.0 in | Wt 305.0 lb

## 2019-12-10 DIAGNOSIS — M5126 Other intervertebral disc displacement, lumbar region: Secondary | ICD-10-CM

## 2019-12-10 NOTE — Progress Notes (Signed)
44 year old male with history of right L5-S1 HNP, low back pain and right lower extremity radiculopathy comes in for preop evaluation.  States that symptoms unchanged from previous visit.  No left leg symptoms.  He is wanting to proceed with right L5-S1 microdiscectomy as scheduled.  Today history and physical performed.  Review of systems negative.  Surgical procedure discussed in detail.  All questions answered.  Advised patient  that for the type of work that he does anticipate him being out at least 6 to 10 weeks postop.  He may be able to return doing light duty with restrictions around 6 weeks postop but we will obviously need to see how he is doing.  He voices understanding.

## 2019-12-14 NOTE — Progress Notes (Signed)
Your procedure is scheduled on Monday December 21, 2019.  Report to [facility] Main Entrance "A" at 10:30 A.M., and check in at the Admitting office.  Call this number if you have problems the morning of surgery: [phone]  Call [phone] if you have any questions prior to your surgery date Monday-Friday 8am-4pm   Remember: Do not eat or drink after midnight the night before your surgery  No medicines needed the morning of surgery, but if needed, ok to take cyclobenzaprine (FLEXERIL)   Please bring all inhalers with you the day of surgery.    7 days prior to surgery STOP taking any Aspirin (unless otherwise instructed by your surgeon), Aleve, Naproxen, Ibuprofen, Motrin, Advil, Goody's, BC's, all herbal medications, fish oil, and all vitamins.    The Morning of Surgery  Do not wear jewelry  Do not wear lotions, powders, colognes, or deodorant Men may shave face and neck.  Do not bring valuables to the hospital.  [facility] is not responsible for any belongings or valuables.  If you are a smoker, DO NOT Smoke 24 hours prior to surgery  If you wear a CPAP at night please bring your mask the morning of surgery   Remember that you must have someone to transport you home after your surgery, and remain with you for 24 hours if you are discharged the same day.   Please bring cases for contacts, glasses, hearing aids, dentures or bridgework because it cannot be worn into surgery.    Leave your suitcase in the car.  After surgery it may be brought to your room.  For patients admitted to the hospital, discharge time will be determined by your treatment team.  Patients discharged the day of surgery will not be allowed to drive home.    Special instructions:   Tyler Atkins- Preparing For Surgery  Before surgery, you can play an important role. Because skin is not sterile, your skin needs to be as free of germs as possible. You can reduce the number of germs on your skin by  washing with CHG (chlorahexidine gluconate) Soap before surgery.  CHG is an antiseptic cleaner which kills germs and bonds with the skin to continue killing germs even after washing.    Oral Hygiene is also important to reduce your risk of infection.  Remember - BRUSH YOUR TEETH THE MORNING OF SURGERY WITH YOUR REGULAR TOOTHPASTE  Please do not use if you have an allergy to CHG or antibacterial soaps. If your skin becomes reddened/irritated stop using the CHG.  Do not shave (including legs and underarms) for at least 48 hours prior to first CHG shower. It is OK to shave your face.  Please follow these instructions carefully.   1. Shower the NIGHT BEFORE SURGERY and the MORNING OF SURGERY with CHG Soap.   2. If you chose to wash your hair and body, wash as usual with your normal shampoo and body-wash/soap.  3. Rinse your hair and body thoroughly to remove the shampoo and soap.  4. Apply CHG directly to the skin (ONLY FROM THE NECK DOWN) and wash gently with a scrungie or a clean washcloth.   5. Do not use on open wounds or open sores. Avoid contact with your eyes, ears, mouth and genitals (private parts). Wash Face and genitals (private parts)  with your normal soap.   6. Wash thoroughly, paying special attention to the area where your surgery will be performed.  7. Thoroughly rinse your body with warm  water from the neck down.  8. DO NOT shower/wash with your normal soap after using and rinsing off the CHG Soap.  9. Pat yourself dry with a CLEAN TOWEL.  10. Wear CLEAN PAJAMAS to bed the night before surgery  11. Place CLEAN SHEETS on your bed the night of your first shower and DO NOT SLEEP WITH PETS.  12. Wear comfortable clothes the morning of surgery.     Day of Surgery:  Please shower the morning of surgery with the CHG soap Do not apply any deodorants/lotions. Please wear clean clothes to the hospital/surgery center.   Remember to brush your teeth WITH YOUR REGULAR  TOOTHPASTE.   Please read over the following fact sheets that you were given.

## 2019-12-15 ENCOUNTER — Telehealth: Payer: Self-pay | Admitting: Radiology

## 2019-12-15 ENCOUNTER — Encounter (HOSPITAL_COMMUNITY)
Admission: RE | Admit: 2019-12-15 | Discharge: 2019-12-15 | Disposition: A | Discharge status: Home / Self Care | Payer: Managed Care, Other (non HMO) | Source: Ambulatory Visit | Attending: Orthopaedic Surgery | Admitting: Orthopaedic Surgery

## 2019-12-15 ENCOUNTER — Encounter (HOSPITAL_COMMUNITY): Payer: Self-pay

## 2019-12-15 ENCOUNTER — Telehealth: Payer: Self-pay | Admitting: Surgery

## 2019-12-15 ENCOUNTER — Other Ambulatory Visit: Payer: Self-pay

## 2019-12-15 DIAGNOSIS — E119 Type 2 diabetes mellitus without complications: Secondary | ICD-10-CM | POA: Diagnosis not present

## 2019-12-15 DIAGNOSIS — Z01818 Encounter for other preprocedural examination: Secondary | ICD-10-CM | POA: Insufficient documentation

## 2019-12-15 HISTORY — DX: Depression, unspecified: F32.A

## 2019-12-15 HISTORY — DX: Prediabetes: R73.03

## 2019-12-15 HISTORY — DX: Headache, unspecified: R51.9

## 2019-12-15 LAB — COMPREHENSIVE METABOLIC PANEL
ALT: 78 U/L — ABNORMAL HIGH (ref 0–44)
AST: 41 U/L (ref 15–41)
Albumin: 4.2 g/dL (ref 3.5–5.0)
Alkaline Phosphatase: 75 U/L (ref 38–126)
Anion gap: 11 (ref 5–15)
BUN: 15 mg/dL (ref 6–20)
CO2: 25 mmol/L (ref 22–32)
Calcium: 9.5 mg/dL (ref 8.9–10.3)
Chloride: 100 mmol/L (ref 98–111)
Creatinine, Ser: 1 mg/dL (ref 0.61–1.24)
GFR calc Af Amer: >60 mL/min (ref >60)
GFR calc non Af Amer: >60 mL/min (ref >60)
Glucose, Bld: 304 mg/dL — ABNORMAL HIGH (ref 70–99)
Potassium: 4.2 mmol/L (ref 3.5–5.1)
Sodium: 136 mmol/L (ref 135–145)
Total Bilirubin: 0.9 mg/dL (ref 0.3–1.2)
Total Protein: 6.9 g/dL (ref 6.5–8.1)

## 2019-12-15 LAB — CBC
HCT: 48 % (ref 39.0–52.0)
Hemoglobin: 16.8 g/dL (ref 13.0–17.0)
MCH: 30 pg (ref 26.0–34.0)
MCHC: 35 g/dL (ref 30.0–36.0)
MCV: 85.7 fL (ref 80.0–100.0)
Platelets: 180 10*3/uL (ref 150–400)
RBC: 5.6 MIL/uL (ref 4.22–5.81)
RDW: 12.4 % (ref 11.5–15.5)
WBC: 10.7 10*3/uL — ABNORMAL HIGH (ref 4.0–10.5)
nRBC: 0 % (ref 0.0–0.2)

## 2019-12-15 LAB — URINALYSIS, ROUTINE W REFLEX MICROSCOPIC
Bacteria, UA: NONE SEEN
Bilirubin Urine: NEGATIVE
Glucose, UA: >=500 mg/dL — AB
Hgb urine dipstick: NEGATIVE
Ketones, ur: NEGATIVE mg/dL
Leukocytes,Ua: NEGATIVE
Nitrite: NEGATIVE
Protein, ur: NEGATIVE mg/dL
Specific Gravity, Urine: 1.031 — ABNORMAL HIGH (ref 1.005–1.030)
pH: 6 (ref 5.0–8.0)

## 2019-12-15 LAB — HEMOGLOBIN A1C
Hgb A1c MFr Bld: 8.3 % — ABNORMAL HIGH (ref 4.8–5.6)
Mean Plasma Glucose: 191.51 mg/dL

## 2019-12-15 LAB — GLUCOSE, CAPILLARY: Glucose-Capillary: 299 mg/dL — ABNORMAL HIGH (ref 70–99)

## 2019-12-15 LAB — SURGICAL PCR SCREEN
MRSA, PCR: NEGATIVE
Staphylococcus aureus: NEGATIVE

## 2019-12-15 NOTE — Telephone Encounter (Signed)
FYI

## 2019-12-15 NOTE — Progress Notes (Addendum)
Your procedure is scheduled on Monday December 21, 2019.  Report to [facility] Main Entrance "A" at 10:30 A.M., and check in at the Admitting office.  Call this number if you have problems the morning of surgery: [phone]  Call [phone] if you have any questions prior to your surgery date Monday-Friday 8am-4pm   Do not eat after midnight the night before your surgery.  You may drink clear liquids until 09:30 AM the morning of your surgery.    Clear liquids allowed are: Water, Non-Citrus Juices (without pulp), Carbonated Beverages, Clear Tea, Black Coffee Only, and Gatorade.  Please complete your PRE-SURGERY 10 oz water that was provided to you by 09:30 AM the morning of surgery.  Please, if able, drink it in one setting. DO NOT SIP.   No medicines needed the morning of surgery, but if needed, ok to take cyclobenzaprine (FLEXERIL)   Please bring all inhalers with you the day of surgery.    7 days prior to surgery STOP taking any Aspirin (unless otherwise instructed by your surgeon), Aleve, Naproxen, Ibuprofen, Motrin, Advil, Goody's, BC's, all herbal medications, fish oil, and all vitamins.    The Morning of Surgery  Do not wear jewelry  Do not wear lotions, powders, colognes, or deodorant Men may shave face and neck.  Do not bring valuables to the hospital.  [facility] is not responsible for any belongings or valuables.  If you are a smoker, DO NOT Smoke 24 hours prior to surgery  If you wear a CPAP at night please bring your mask the morning of surgery   Remember that you must have someone to transport you home after your surgery, and remain with you for 24 hours if you are discharged the same day.   Please bring cases for contacts, glasses, hearing aids, dentures or bridgework because it cannot be worn into surgery.    Leave your suitcase in the car.  After surgery it may be brought to your room.  For patients admitted to the hospital, discharge time will be  determined by your treatment team.  Patients discharged the day of surgery will not be allowed to drive home.    Special instructions:   [facility]- Preparing For Surgery  Before surgery, you can play an important role. Because skin is not sterile, your skin needs to be as free of germs as possible. You can reduce the number of germs on your skin by washing with CHG (chlorahexidine gluconate) Soap before surgery.  CHG is an antiseptic cleaner which kills germs and bonds with the skin to continue killing germs even after washing.    Oral Hygiene is also important to reduce your risk of infection.  Remember - BRUSH YOUR TEETH THE MORNING OF SURGERY WITH YOUR REGULAR TOOTHPASTE  Please do not use if you have an allergy to CHG or antibacterial soaps. If your skin becomes reddened/irritated stop using the CHG.  Do not shave (including legs and underarms) for at least 48 hours prior to first CHG shower. It is OK to shave your face.  Please follow these instructions carefully.   1. Shower the NIGHT BEFORE SURGERY and the MORNING OF SURGERY with CHG Soap.   2. If you chose to wash your hair and body, wash as usual with your normal shampoo and body-wash/soap.  3. Rinse your hair and body thoroughly to remove the shampoo and soap.  4. Apply CHG directly to the skin (ONLY FROM THE NECK DOWN) and wash gently with  a scrungie or a clean washcloth.   5. Do not use on open wounds or open sores. Avoid contact with your eyes, ears, mouth and genitals (private parts). Wash Face and genitals (private parts)  with your normal soap.   6. Wash thoroughly, paying special attention to the area where your surgery will be performed.  7. Thoroughly rinse your body with warm water from the neck down.  8. DO NOT shower/wash with your normal soap after using and rinsing off the CHG Soap.  9. Pat yourself dry with a CLEAN TOWEL.  10. Wear CLEAN PAJAMAS to bed the night before surgery  11. Place CLEAN SHEETS on  your bed the night of your first shower and DO NOT SLEEP WITH PETS.  12. Wear comfortable clothes the morning of surgery.     Day of Surgery:  Please shower the morning of surgery with the CHG soap Do not apply any deodorants/lotions. Please wear clean clothes to the hospital/surgery center.   Remember to brush your teeth WITH YOUR REGULAR TOOTHPASTE.   Please read over the following fact sheets that you were given.

## 2019-12-15 NOTE — Telephone Encounter (Signed)
Pre-admit called to let us know that patients A1c was 8.3 and Blood sugar was 299.

## 2019-12-15 NOTE — Telephone Encounter (Signed)
Spoke with Tyler Atkins at [facility] at [name].  Patient is scheduled to see Dr. Edwina Barth tomorrow March 24th at 9:20am.  Request for surgical clearance was faxed to [phone].  Patient is aware of appointment date and time.

## 2019-12-15 NOTE — Telephone Encounter (Signed)
noted 

## 2019-12-15 NOTE — Progress Notes (Signed)
[employer] Zonia Kief, PA-C to notify about patient's elevated CBG and A1C.

## 2019-12-15 NOTE — Progress Notes (Addendum)
PCP - Edwina Barth, MD Cardiologist - Denies  PPM/ICD - Denies  Chest x-ray - 07/07/19 EKG - 07/08/19 Stress Test - Denies ECHO - Denies Cardiac Cath - Denies  Sleep Study - Denies  Patient states he is a pre-diabetic, does not check blood sugar. A1C obtained during PAT visit.  Blood Thinner Instructions: N/A Aspirin Instructions: N/A  ERAS Protcol - Yes PRE-SURGERY Ensure or G2- 10 oz water.  COVID TEST- 12/17/19   Anesthesia review: Yes, elevated CBG and A1C  Patient denies shortness of breath, fever, cough and chest pain at PAT appointment   All instructions explained to the patient, with a verbal understanding of the material. Patient agrees to go over the instructions while at home for a better understanding. Patient also instructed to self quarantine after being tested for COVID-19. The opportunity to ask questions was provided.

## 2019-12-15 NOTE — Progress Notes (Signed)
Called Dr. Ophelia Charter' office and spoke with nurse to notify of elevated A1C and CBG.

## 2019-12-15 NOTE — Telephone Encounter (Signed)
Today I had a phone conversation with patient regarding his preop labs.  Patient is scheduled for right L5-S1 microdiscectomy December 21, 2019.  Labs showed hemoglobin A1c 8.3, serum glucose 304, and UA glucose greater than 500.  Advised patient that we will try to get him in to see his primary care physician Dr. Edwina Barth this week to see if diabetes medication needs to be started.  We will also need clearance from his physician.  Advised patient that if he did not hear anything regarding the appointment by this Thursday to contact our office to let us know.

## 2019-12-16 ENCOUNTER — Encounter: Payer: Self-pay | Admitting: Emergency Medicine

## 2019-12-16 ENCOUNTER — Ambulatory Visit (INDEPENDENT_AMBULATORY_CARE_PROVIDER_SITE_OTHER): Payer: Managed Care, Other (non HMO) | Admitting: Emergency Medicine

## 2019-12-16 ENCOUNTER — Other Ambulatory Visit: Payer: Self-pay

## 2019-12-16 ENCOUNTER — Encounter (HOSPITAL_COMMUNITY): Payer: Self-pay | Admitting: Physician Assistant

## 2019-12-16 VITALS — BP 125/89 | HR 95 | Temp 98.1°F | Ht 75.0 in | Wt 305.2 lb

## 2019-12-16 DIAGNOSIS — E119 Type 2 diabetes mellitus without complications: Secondary | ICD-10-CM

## 2019-12-16 DIAGNOSIS — E1169 Type 2 diabetes mellitus with other specified complication: Secondary | ICD-10-CM | POA: Insufficient documentation

## 2019-12-16 MED ORDER — GLIPIZIDE 5 MG PO TABS: 5.0000 mg | ORAL_TABLET | Freq: Two times a day (BID) | ORAL | 1 refills | Status: DC

## 2019-12-16 MED ORDER — METFORMIN HCL 1000 MG PO TABS: 1000.0000 mg | ORAL_TABLET | Freq: Two times a day (BID) | ORAL | 3 refills | Status: DC

## 2019-12-16 NOTE — Progress Notes (Signed)
Called patient and left voicemail instructions for oral diabetes medicine as follows:  The Day Before Surgery:  1)Take only morning/ lunch dose of glipiZIDE (GLUCOTROL); Do not take evening dose. 2)Take usual morning and evening dose of` metFORMIN (GLUCOPHAGE).  The Day of Surgery: 1)Do not take glipiZIDE (GLUCOTROL) or metFORMIN (GLUCOPHAGE)

## 2019-12-16 NOTE — Progress Notes (Signed)
Anesthesia Chart Review:  New diagnosis of DMII noted at PAT. BG 304 and A1c 8.3. Pt seen the following day by PCP and started on metformin and glipizide. Not medically cleared for surgery. I spoke with Eunice Blase at Dr. Ophelia Charter office. Case to be cancelled until BG under control and cleared by PCP.  Zannie Cove [facility] Short Stay Center/Anesthesiology Phone [phone] 12/16/2019 2:09 PM

## 2019-12-16 NOTE — Telephone Encounter (Signed)
thanks

## 2019-12-16 NOTE — Patient Instructions (Addendum)
   If you have lab work done today you will be contacted with your lab results within the next 2 weeks.  If you have not heard from us then please contact us. The fastest way to get your results is to register for My Chart.   IF you received an x-ray today, you will receive an invoice from [facility]. Please contact [facility] at [phone] with questions or concerns regarding your invoice.   IF you received labwork today, you will receive an invoice from [facility]. Please contact [facility] at [phone] with questions or concerns regarding your invoice.   Our billing staff will not be able to assist you with questions regarding bills from these companies.  You will be contacted with the lab results as soon as they are available. The fastest way to get your results is to activate your My Chart account. Instructions are located on the last page of this paperwork. If you have not heard from us regarding the results in 2 weeks, please contact this office.      Diabetes Mellitus and Nutrition, Adult When you have diabetes (diabetes mellitus), it is very important to have healthy eating habits because your blood sugar (glucose) levels are greatly affected by what you eat and drink. Eating healthy foods in the appropriate amounts, at about the same times every day, can help you:  Control your blood glucose.  Lower your risk of heart disease.  Improve your blood pressure.  Reach or maintain a healthy weight. Every person with diabetes is different, and each person has different needs for a meal plan. Your health care provider may recommend that you work with a diet and nutrition specialist (dietitian) to make a meal plan that is best for you. Your meal plan may vary depending on factors such as:  The calories you need.  The medicines you take.  Your weight.  Your blood glucose, blood pressure, and cholesterol levels.  Your activity level.  Other health  conditions you have, such as heart or kidney disease. How do carbohydrates affect me? Carbohydrates, also called carbs, affect your blood glucose level more than any other type of food. Eating carbs naturally raises the amount of glucose in your blood. Carb counting is a method for keeping track of how many carbs you eat. Counting carbs is important to keep your blood glucose at a healthy level, especially if you use insulin or take certain oral diabetes medicines. It is important to know how many carbs you can safely have in each meal. This is different for every person. Your dietitian can help you calculate how many carbs you should have at each meal and for each snack. Foods that contain carbs include:  Bread, cereal, rice, pasta, and crackers.  Potatoes and corn.  Peas, beans, and lentils.  Milk and yogurt.  Fruit and juice.  Desserts, such as cakes, cookies, ice cream, and candy. How does alcohol affect me? Alcohol can cause a sudden decrease in blood glucose (hypoglycemia), especially if you use insulin or take certain oral diabetes medicines. Hypoglycemia can be a life-threatening condition. Symptoms of hypoglycemia (sleepiness, dizziness, and confusion) are similar to symptoms of having too much alcohol. If your health care provider says that alcohol is safe for you, follow these guidelines:  Limit alcohol intake to no more than 1 drink per day for nonpregnant women and 2 drinks per day for men. One drink equals 12 oz of beer, 5 oz of wine, or 1 oz of hard   liquor.  Do not drink on an empty stomach.  Keep yourself hydrated with water, diet soda, or unsweetened iced tea.  Keep in mind that regular soda, juice, and other mixers may contain a lot of sugar and must be counted as carbs. What are tips for following this plan?  Reading food labels  Start by checking the serving size on the "Nutrition Facts" label of packaged foods and drinks. The amount of calories, carbs, fats, and  other nutrients listed on the label is based on one serving of the item. Many items contain more than one serving per package.  Check the total grams (g) of carbs in one serving. You can calculate the number of servings of carbs in one serving by dividing the total carbs by 15. For example, if a food has 30 g of total carbs, it would be equal to 2 servings of carbs.  Check the number of grams (g) of saturated and trans fats in one serving. Choose foods that have low or no amount of these fats.  Check the number of milligrams (mg) of salt (sodium) in one serving. Most people should limit total sodium intake to less than 2,300 mg per day.  Always check the nutrition information of foods labeled as "low-fat" or "nonfat". These foods may be higher in added sugar or refined carbs and should be avoided.  Talk to your dietitian to identify your daily goals for nutrients listed on the label. Shopping  Avoid buying canned, premade, or processed foods. These foods tend to be high in fat, sodium, and added sugar.  Shop around the outside edge of the grocery store. This includes fresh fruits and vegetables, bulk grains, fresh meats, and fresh dairy. Cooking  Use low-heat cooking methods, such as baking, instead of high-heat cooking methods like deep frying.  Cook using healthy oils, such as olive, canola, or sunflower oil.  Avoid cooking with butter, cream, or high-fat meats. Meal planning  Eat meals and snacks regularly, preferably at the same times every day. Avoid going long periods of time without eating.  Eat foods high in fiber, such as fresh fruits, vegetables, beans, and whole grains. Talk to your dietitian about how many servings of carbs you can eat at each meal.  Eat 4-6 ounces (oz) of lean protein each day, such as lean meat, chicken, fish, eggs, or tofu. One oz of lean protein is equal to: ? 1 oz of meat, chicken, or fish. ? 1 egg. ?  cup of tofu.  Eat some foods each day that  contain healthy fats, such as avocado, nuts, seeds, and fish. Lifestyle  Check your blood glucose regularly.  Exercise regularly as told by your health care provider. This may include: ? 150 minutes of moderate-intensity or vigorous-intensity exercise each week. This could be brisk walking, biking, or water aerobics. ? Stretching and doing strength exercises, such as yoga or weightlifting, at least 2 times a week.  Take medicines as told by your health care provider.  Do not use any products that contain nicotine or tobacco, such as cigarettes and e-cigarettes. If you need help quitting, ask your health care provider.  Work with a counselor or diabetes educator to identify strategies to manage stress and any emotional and social challenges. Questions to ask a health care provider  Do I need to meet with a diabetes educator?  Do I need to meet with a dietitian?  What number can I call if I have questions?  When are the best   times to check my blood glucose? Where to find more information:  American Diabetes Association: diabetes.org  Academy of Nutrition and Dietetics: www.eatright.org  National Institute of Diabetes and Digestive and Kidney Diseases (NIH): www.niddk.nih.gov Summary  A healthy meal plan will help you control your blood glucose and maintain a healthy lifestyle.  Working with a diet and nutrition specialist (dietitian) can help you make a meal plan that is best for you.  Keep in mind that carbohydrates (carbs) and alcohol have immediate effects on your blood glucose levels. It is important to count carbs and to use alcohol carefully. This information is not intended to replace advice given to you by your health care provider. Make sure you discuss any questions you have with your health care provider. Document Revised: 08/23/2017 Document Reviewed: 10/15/2016 Elsevier Patient Education  2020 Elsevier Inc.  

## 2019-12-16 NOTE — Progress Notes (Signed)
Tyler Atkins 44 y.o.   Chief Complaint  Patient presents with  . back surgery Clearance    last check was elevated glucose     HISTORY OF PRESENT ILLNESS: This is a 44 y.o. male scheduled for lumbar surgery next Monday, however preop blood results from yesterday show hyperglycemia and elevated hemoglobin A1c.  Has a history of prediabetes.  Hemoglobin A1c yesterday at 8.3.  Hemoglobin A1c in 2019 was 6.0.  He is on no medications at present time.  Blood results reviewed with patient in the room. Results for orders placed or performed during the hospital encounter of 12/15/19 (from the past 24 hour(s))  Glucose, capillary     Status: Abnormal   Collection Time: 12/15/19  9:58 AM  Result Value Ref Range   Glucose-Capillary 299 (H) 70 - 99 mg/dL  Surgical pcr screen     Status: None   Collection Time: 12/15/19  9:59 AM   Specimen: Nasal Mucosa; Nasal Swab  Result Value Ref Range   MRSA, PCR NEGATIVE NEGATIVE   Staphylococcus aureus NEGATIVE NEGATIVE  CBC     Status: Abnormal   Collection Time: 12/15/19  9:59 AM  Result Value Ref Range   WBC 10.7 (H) 4.0 - 10.5 K/uL   RBC 5.60 4.22 - 5.81 MIL/uL   Hemoglobin 16.8 13.0 - 17.0 g/dL   HCT 48.0 39.0 - 52.0 %   MCV 85.7 80.0 - 100.0 fL   MCH 30.0 26.0 - 34.0 pg   MCHC 35.0 30.0 - 36.0 g/dL   RDW 12.4 11.5 - 15.5 %   Platelets 180 150 - 400 K/uL   nRBC 0.0 0.0 - 0.2 %  Comprehensive metabolic panel     Status: Abnormal   Collection Time: 12/15/19  9:59 AM  Result Value Ref Range   Sodium 136 135 - 145 mmol/L   Potassium 4.2 3.5 - 5.1 mmol/L   Chloride 100 98 - 111 mmol/L   CO2 25 22 - 32 mmol/L   Glucose, Bld 304 (H) 70 - 99 mg/dL   BUN 15 6 - 20 mg/dL   Creatinine, Ser 1.00 0.61 - 1.24 mg/dL   Calcium 9.5 8.9 - 10.3 mg/dL   Total Protein 6.9 6.5 - 8.1 g/dL   Albumin 4.2 3.5 - 5.0 g/dL   AST 41 15 - 41 U/L   ALT 78 (H) 0 - 44 U/L   Alkaline Phosphatase 75 38 - 126 U/L   Total Bilirubin 0.9 0.3 - 1.2 mg/dL   GFR calc non Af  Amer >60 >60 mL/min   GFR calc Af Amer >60 >60 mL/min   Anion gap 11 5 - 15  Urinalysis, Routine w reflex microscopic     Status: Abnormal   Collection Time: 12/15/19  9:59 AM  Result Value Ref Range   Color, Urine YELLOW YELLOW   APPearance CLEAR CLEAR   Specific Gravity, Urine 1.031 (H) 1.005 - 1.030   pH 6.0 5.0 - 8.0   Glucose, UA >=500 (A) NEGATIVE mg/dL   Hgb urine dipstick NEGATIVE NEGATIVE   Bilirubin Urine NEGATIVE NEGATIVE   Ketones, ur NEGATIVE NEGATIVE mg/dL   Protein, ur NEGATIVE NEGATIVE mg/dL   Nitrite NEGATIVE NEGATIVE   Leukocytes,Ua NEGATIVE NEGATIVE   RBC / HPF 0-5 0 - 5 RBC/hpf   WBC, UA 0-5 0 - 5 WBC/hpf   Bacteria, UA NONE SEEN NONE SEEN   Squamous Epithelial / LPF 0-5 0 - 5  Hemoglobin A1c     Status: Abnormal  Collection Time: 12/15/19  9:59 AM  Result Value Ref Range   Hgb A1c MFr Bld 8.3 (H) 4.8 - 5.6 %   Mean Plasma Glucose 191.51 mg/dL    HPI   Prior to Admission medications   Medication Sig Start Date End Date Taking? Authorizing Provider  cyclobenzaprine (FLEXERIL) 10 MG tablet Take 1 tablet (10 mg total) by mouth 3 (three) times daily as needed for muscle spasms. 12/07/19  Yes Hilts, Casimiro Needle, MD    No Known Allergies  There are no problems to display for this patient.   Past Medical History:  Diagnosis Date  . Depression   . Headache    MIGRAINES  . Pre-diabetes   . PTSD (post-traumatic stress disorder)   . UTI (lower urinary tract infection)     Past Surgical History:  Procedure Laterality Date  . CERVICAL DISC ARTHROPLASTY    . TONSILLECTOMY      Social History   Socioeconomic History  . Marital status: Married    Spouse name: Not on file  . Number of children: Not on file  . Years of education: Not on file  . Highest education level: Not on file  Occupational History  . Not on file  Tobacco Use  . Smoking status: Current Every Day Smoker    Packs/day: 1.00    Types: Cigarettes  . Smokeless tobacco: Never Used    Substance and Sexual Activity  . Alcohol use: Yes    Comment: 1-2x/month  . Drug use: No  . Sexual activity: Not on file  Other Topics Concern  . Not on file  Social History Narrative  . Not on file   Social Determinants of Health   Financial Resource Strain:   . Difficulty of Paying Living Expenses:   Food Insecurity:   . Worried About Programme researcher, broadcasting/film/video in the Last Year:   . Barista in the Last Year:   Transportation Needs:   . Freight forwarder (Medical):   Marland Kitchen Lack of Transportation (Non-Medical):   Physical Activity:   . Days of Exercise per Week:   . Minutes of Exercise per Session:   Stress:   . Feeling of Stress :   Social Connections:   . Frequency of Communication with Friends and Family:   . Frequency of Social Gatherings with Friends and Family:   . Attends Religious Services:   . Active Member of Clubs or Organizations:   . Attends Banker Meetings:   Marland Kitchen Marital Status:   Intimate Partner Violence:   . Fear of Current or Ex-Partner:   . Emotionally Abused:   Marland Kitchen Physically Abused:   . Sexually Abused:     Family History  Problem Relation Age of Onset  . Cancer Mother   . Heart disease Father      Review of Systems  Constitutional: Negative.  Negative for chills and fever.  HENT: Negative.  Negative for congestion and sore throat.   Respiratory: Negative.  Negative for cough and shortness of breath.   Cardiovascular: Negative.  Negative for chest pain and palpitations.  Gastrointestinal: Negative.  Negative for abdominal pain, diarrhea, nausea and vomiting.  Genitourinary: Negative.  Negative for dysuria.  Musculoskeletal: Positive for back pain (Chronic low back pain).  Skin: Negative.  Negative for rash.  Neurological: Negative.  Negative for dizziness and headaches.  All other systems reviewed and are negative.  Today's Vitals   12/16/19 0913  BP: 125/89  Pulse: 95  Temp:  98.1 F (36.7 C)  TempSrc: Temporal  SpO2:  96%  Weight: (!) 305 lb 3.2 oz (138.4 kg)  Height: 6\' 3"  (1.905 m)   Body mass index is 38.15 kg/m.   Physical Exam Vitals reviewed.  Constitutional:      Appearance: Normal appearance.  HENT:     Head: Normocephalic.  Eyes:     Extraocular Movements: Extraocular movements intact.     Pupils: Pupils are equal, round, and reactive to light.  Cardiovascular:     Rate and Rhythm: Normal rate and regular rhythm.     Pulses: Normal pulses.     Heart sounds: Normal heart sounds.  Pulmonary:     Effort: Pulmonary effort is normal.     Breath sounds: Normal breath sounds.  Musculoskeletal:     Cervical back: Normal range of motion and neck supple.  Skin:    General: Skin is warm and dry.     Capillary Refill: Capillary refill takes less than 2 seconds.  Neurological:     General: No focal deficit present.     Mental Status: He is alert and oriented to person, place, and time.  Psychiatric:        Mood and Affect: Mood normal.        Behavior: Behavior normal.      ASSESSMENT & PLAN: Not medically clear for surgery. Saint was seen today for back surgery clearance.  Diagnoses and all orders for this visit:  New onset type 2 diabetes mellitus (HCC) -     metFORMIN (GLUCOPHAGE) 1000 MG tablet; Take 1 tablet (1,000 mg total) by mouth 2 (two) times daily with a meal. -     glipiZIDE (GLUCOTROL) 5 MG tablet; Take 1 tablet (5 mg total) by mouth 2 (two) times daily with a meal.    Patient Instructions       If you have lab work done today you will be contacted with your lab results within the next 2 weeks.  If you have not heard from Elita Quick then please contact us. The fastest way to get your results is to register for My Chart.   IF you received an x-ray today, you will receive an invoice from [facility]. Please contact [facility] at [phone] with questions or concerns regarding your invoice.   IF you received labwork today, you will receive an invoice  from [facility]. Please contact [facility] at [phone] with questions or concerns regarding your invoice.   Our billing staff will not be able to assist you with questions regarding bills from these companies.  You will be contacted with the lab results as soon as they are available. The fastest way to get your results is to activate your My Chart account. Instructions are located on the last page of this paperwork. If you have not heard from [phone] regarding the results in 2 weeks, please contact this office.      Diabetes Mellitus and Nutrition, Adult When you have diabetes (diabetes mellitus), it is very important to have healthy eating habits because your blood sugar (glucose) levels are greatly affected by what you eat and drink. Eating healthy foods in the appropriate amounts, at about the same times every day, can help you:  Control your blood glucose.  Lower your risk of heart disease.  Improve your blood pressure.  Reach or maintain a healthy weight. Every person with diabetes is different, and each person has different needs for a meal plan. Your health care provider may recommend that  you work with a diet and nutrition specialist (dietitian) to make a meal plan that is best for you. Your meal plan may vary depending on factors such as:  The calories you need.  The medicines you take.  Your weight.  Your blood glucose, blood pressure, and cholesterol levels.  Your activity level.  Other health conditions you have, such as heart or kidney disease. How do carbohydrates affect me? Carbohydrates, also called carbs, affect your blood glucose level more than any other type of food. Eating carbs naturally raises the amount of glucose in your blood. Carb counting is a method for keeping track of how many carbs you eat. Counting carbs is important to keep your blood glucose at a healthy level, especially if you use insulin or take certain oral diabetes medicines. It is important to  know how many carbs you can safely have in each meal. This is different for every person. Your dietitian can help you calculate how many carbs you should have at each meal and for each snack. Foods that contain carbs include:  Bread, cereal, rice, pasta, and crackers.  Potatoes and corn.  Peas, beans, and lentils.  Milk and yogurt.  Fruit and juice.  Desserts, such as cakes, cookies, ice cream, and candy. How does alcohol affect me? Alcohol can cause a sudden decrease in blood glucose (hypoglycemia), especially if you use insulin or take certain oral diabetes medicines. Hypoglycemia can be a life-threatening condition. Symptoms of hypoglycemia (sleepiness, dizziness, and confusion) are similar to symptoms of having too much alcohol. If your health care provider says that alcohol is safe for you, follow these guidelines:  Limit alcohol intake to no more than 1 drink per day for nonpregnant women and 2 drinks per day for men. One drink equals 12 oz of beer, 5 oz of wine, or 1 oz of hard liquor.  Do not drink on an empty stomach.  Keep yourself hydrated with water, diet soda, or unsweetened iced tea.  Keep in mind that regular soda, juice, and other mixers may contain a lot of sugar and must be counted as carbs. What are tips for following this plan?  Reading food labels  Start by checking the serving size on the "Nutrition Facts" label of packaged foods and drinks. The amount of calories, carbs, fats, and other nutrients listed on the label is based on one serving of the item. Many items contain more than one serving per package.  Check the total grams (g) of carbs in one serving. You can calculate the number of servings of carbs in one serving by dividing the total carbs by 15. For example, if a food has 30 g of total carbs, it would be equal to 2 servings of carbs.  Check the number of grams (g) of saturated and trans fats in one serving. Choose foods that have low or no amount of  these fats.  Check the number of milligrams (mg) of salt (sodium) in one serving. Most people should limit total sodium intake to less than 2,300 mg per day.  Always check the nutrition information of foods labeled as "low-fat" or "nonfat". These foods may be higher in added sugar or refined carbs and should be avoided.  Talk to your dietitian to identify your daily goals for nutrients listed on the label. Shopping  Avoid buying canned, premade, or processed foods. These foods tend to be high in fat, sodium, and added sugar.  Shop around the outside edge of the grocery store. This  includes fresh fruits and vegetables, bulk grains, fresh meats, and fresh dairy. Cooking  Use low-heat cooking methods, such as baking, instead of high-heat cooking methods like deep frying.  Cook using healthy oils, such as olive, canola, or sunflower oil.  Avoid cooking with butter, cream, or high-fat meats. Meal planning  Eat meals and snacks regularly, preferably at the same times every day. Avoid going long periods of time without eating.  Eat foods high in fiber, such as fresh fruits, vegetables, beans, and whole grains. Talk to your dietitian about how many servings of carbs you can eat at each meal.  Eat 4-6 ounces (oz) of lean protein each day, such as lean meat, chicken, fish, eggs, or tofu. One oz of lean protein is equal to: ? 1 oz of meat, chicken, or fish. ? 1 egg. ?  cup of tofu.  Eat some foods each day that contain healthy fats, such as avocado, nuts, seeds, and fish. Lifestyle  Check your blood glucose regularly.  Exercise regularly as told by your health care provider. This may include: ? 150 minutes of moderate-intensity or vigorous-intensity exercise each week. This could be brisk walking, biking, or water aerobics. ? Stretching and doing strength exercises, such as yoga or weightlifting, at least 2 times a week.  Take medicines as told by your health care provider.  Do not use  any products that contain nicotine or tobacco, such as cigarettes and e-cigarettes. If you need help quitting, ask your health care provider.  Work with a Veterinary surgeon or diabetes educator to identify strategies to manage stress and any emotional and social challenges. Questions to ask a health care provider  Do I need to meet with a diabetes educator?  Do I need to meet with a dietitian?  What number can I call if I have questions?  When are the best times to check my blood glucose? Where to find more information:  American Diabetes Association: diabetes.org  Academy of Nutrition and Dietetics: www.eatright.[employer] of Diabetes and Digestive and Kidney Diseases (NIH): CarFlippers.tn Summary  A healthy meal plan will help you control your blood glucose and maintain a healthy lifestyle.  Working with a diet and nutrition specialist (dietitian) can help you make a meal plan that is best for you.  Keep in mind that carbohydrates (carbs) and alcohol have immediate effects on your blood glucose levels. It is important to count carbs and to use alcohol carefully. This information is not intended to replace advice given to you by your health care provider. Make sure you discuss any questions you have with your health care provider. Document Revised: 08/23/2017 Document Reviewed: 10/15/2016 Elsevier Patient Education  2020 Elsevier Inc.      Edwina Barth, MD [facility]

## 2019-12-17 ENCOUNTER — Other Ambulatory Visit (HOSPITAL_COMMUNITY): Payer: Managed Care, Other (non HMO)

## 2019-12-17 ENCOUNTER — Telehealth: Payer: Self-pay | Admitting: Emergency Medicine

## 2019-12-17 NOTE — Telephone Encounter (Signed)
Debbie called from [facility] , Dr. Ophelia Charter office wanted to let Aram Beecham know that , this patient wont be having the surgery because his a1c is too high , so dont ook for paperwork .

## 2019-12-18 ENCOUNTER — Telehealth: Payer: Self-pay

## 2019-12-18 NOTE — Telephone Encounter (Signed)
FYI: Pt is not able to have his surgery his A1C is too high.

## 2019-12-18 NOTE — Telephone Encounter (Signed)
Message sent to the provider as an [place]

## 2019-12-19 NOTE — Telephone Encounter (Signed)
Acknowledged. Thanks

## 2019-12-21 ENCOUNTER — Encounter (HOSPITAL_COMMUNITY): Admission: RE | Payer: Self-pay | Source: Ambulatory Visit

## 2019-12-21 ENCOUNTER — Ambulatory Visit (HOSPITAL_COMMUNITY)
Admission: RE | Admit: 2019-12-21 | Payer: Managed Care, Other (non HMO) | Source: Ambulatory Visit | Admitting: Orthopaedic Surgery

## 2019-12-21 SURGERY — MICRODISCECTOMY LUMBAR LAMINECTOMY
Anesthesia: General

## 2019-12-29 ENCOUNTER — Inpatient Hospital Stay: Payer: Managed Care, Other (non HMO) | Admitting: Orthopaedic Surgery

## 2020-01-01 ENCOUNTER — Telehealth: Payer: Self-pay | Admitting: Emergency Medicine

## 2020-01-01 NOTE — Telephone Encounter (Signed)
Called pt to make him aware that his pcp will be out of the office until 01/11/2020. Pt needs forms completed to be able to receive benefits.

## 2020-01-01 NOTE — Telephone Encounter (Signed)
Pt called and stated that our office should have received a fax on 4/7 from [employer]. They sent forms for provider to fill out. All the information to send it back should be on the forms. Please advise.

## 2020-01-06 ENCOUNTER — Telehealth: Payer: Self-pay | Admitting: *Deleted

## 2020-01-06 ENCOUNTER — Telehealth: Payer: Self-pay | Admitting: Emergency Medicine

## 2020-01-06 NOTE — Telephone Encounter (Signed)
Spoke to patient about Disability forms faxed from [place] on 12/30/2019. I advised patient again Dr Marchia Bond is out of the office until next Monday (01/11/20). When he returns he will review all of the paperwork that is in his box, including yours. Patient states he will make virtual appointment to discuss the forms. His surgeon at [facility] postponed the surgery because his A1c was elevated (8.0).

## 2020-01-06 NOTE — Telephone Encounter (Signed)
disability paperwork came via fax  To be completed by proveider/ paper work from [employer] placed in provider box at nurses station

## 2020-01-06 NOTE — Telephone Encounter (Signed)
Spoke to patient concerning Disability forms faxed from [place] on 12/30/2019. I advised patient again the doctor will return to the office on 01/11/2020. The patient will make a virtual appointment to explain in detail what is happening with Rosann Auerbach situation and what the surgeon stated.

## 2020-01-11 ENCOUNTER — Other Ambulatory Visit: Payer: Self-pay

## 2020-01-11 ENCOUNTER — Telehealth (INDEPENDENT_AMBULATORY_CARE_PROVIDER_SITE_OTHER): Payer: Managed Care, Other (non HMO) | Admitting: Emergency Medicine

## 2020-01-11 ENCOUNTER — Encounter: Payer: Self-pay | Admitting: Emergency Medicine

## 2020-01-11 VITALS — Ht 75.0 in | Wt 300.0 lb

## 2020-01-11 DIAGNOSIS — M5126 Other intervertebral disc displacement, lumbar region: Secondary | ICD-10-CM | POA: Diagnosis not present

## 2020-01-11 DIAGNOSIS — M5431 Sciatica, right side: Secondary | ICD-10-CM | POA: Diagnosis not present

## 2020-01-11 DIAGNOSIS — E119 Type 2 diabetes mellitus without complications: Secondary | ICD-10-CM

## 2020-01-11 NOTE — Patient Instructions (Signed)
° ° ° °  If you have lab work done today you will be contacted with your lab results within the next 2 weeks.  If you have not heard from us then please contact us. The fastest way to get your results is to register for My Chart. ° ° °IF you received an x-ray today, you will receive an invoice from [facility]. Please contact [facility] at [phone] with questions or concerns regarding your invoice.  ° °IF you received labwork today, you will receive an invoice from [facility]. Please contact [facility] at [phone] with questions or concerns regarding your invoice.  ° °Our billing staff will not be able to assist you with questions regarding bills from these companies. ° °You will be contacted with the lab results as soon as they are available. The fastest way to get your results is to activate your My Chart account. Instructions are located on the last page of this paperwork. If you have not heard from us regarding the results in 2 weeks, please contact this office. °  ° ° ° °

## 2020-01-11 NOTE — Progress Notes (Signed)
Telemedicine Encounter- SOAP NOTE Established Patient MyChart video conference This video telephone encounter was conducted with the patient's (or proxy's) verbal consent via video audio telecommunications: yes/no: Yes Patient was instructed to have this encounter in a suitably private space; and to only have persons present to whom they give permission to participate. In addition, patient identity was confirmed by use of name plus two identifiers (DOB and address).  I discussed the limitations, risks, security and privacy concerns of performing an evaluation and management service by telephone and the availability of in person appointments. I also discussed with the patient that there may be a patient responsible charge related to this service. The patient expressed understanding and agreed to proceed.  I spent a total of TIME; 0 MIN TO 60 MIN: 15 minutes talking with the patient or their proxy.  Chief Complaint  Patient presents with  . Sciatica    pain of the right leg, per pt surgery was for 3 /29/2021 A1c was elevated - TO DISCUSS DISABILITY PAPER WORK    Subjective   Tyler Atkins is a 44 y.o. male established patient. Telephone visit today for short-term disability paperwork. Was seen by me on 11/17/2019.  Symptoms started on 11/14/2019.  Referred to orthopedics. Seen by orthopedist and scheduled for surgery due to HNP lumbar area.  Preop work-up showed uncontrolled new onset diabetes.  Started on Metformin and glipizide.  Surgery postponed. Patient physically very limited due to symptoms.  Physical work that requires lots of walking, lifting, carrying, pushing and pulling.  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  HPI   Patient Active Problem List   Diagnosis Date Noted  . New onset type 2 diabetes mellitus ([place]) 12/16/2019    Past Medical History:  Diagnosis Date  . Depression   . Headache    MIGRAINES  . Pre-diabetes   . PTSD (post-traumatic stress disorder)   . UTI (lower  urinary tract infection)     Current Outpatient Medications  Medication Sig Dispense Refill  . cyclobenzaprine (FLEXERIL) 10 MG tablet Take 1 tablet (10 mg total) by mouth 3 (three) times daily as needed for muscle spasms. 90 tablet 1  . glipiZIDE (GLUCOTROL) 5 MG tablet Take 1 tablet (5 mg total) by mouth 2 (two) times daily with a meal. 180 tablet 1  . metFORMIN (GLUCOPHAGE) 1000 MG tablet Take 1 tablet (1,000 mg total) by mouth 2 (two) times daily with a meal. 180 tablet 3   No current facility-administered medications for this visit.    No Known Allergies  Social History   Socioeconomic History  . Marital status: Married    Spouse name: Not on file  . Number of children: Not on file  . Years of education: Not on file  . Highest education level: Not on file  Occupational History  . Not on file  Tobacco Use  . Smoking status: Current Every Day Smoker    Packs/day: 1.00    Types: Cigarettes  . Smokeless tobacco: Never Used  Substance and Sexual Activity  . Alcohol use: Yes    Comment: 1-2x/month  . Drug use: No  . Sexual activity: Not on file  Other Topics Concern  . Not on file  Social History Narrative  . Not on file   Social Determinants of Health   Financial Resource Strain:   . Difficulty of Paying Living Expenses:   Food Insecurity:   . Worried About Charity fundraiser in the Last Year:   .  Ran Out of Food in the Last Year:   Transportation Needs:   . Freight forwarder (Medical):   Marland Kitchen Lack of Transportation (Non-Medical):   Physical Activity:   . Days of Exercise per Week:   . Minutes of Exercise per Session:   Stress:   . Feeling of Stress :   Social Connections:   . Frequency of Communication with Friends and Family:   . Frequency of Social Gatherings with Friends and Family:   . Attends Religious Services:   . Active Member of Clubs or Organizations:   . Attends Banker Meetings:   Marland Kitchen Marital Status:   Intimate Partner Violence:    . Fear of Current or Ex-Partner:   . Emotionally Abused:   Marland Kitchen Physically Abused:   . Sexually Abused:     Review of Systems  Constitutional: Negative.  Negative for chills and fever.  HENT: Negative.  Negative for congestion and sore throat.   Respiratory: Negative.  Negative for cough and shortness of breath.   Cardiovascular: Negative.  Negative for chest pain and palpitations.  Gastrointestinal: Negative.  Negative for abdominal pain, diarrhea, nausea and vomiting.  Genitourinary: Negative.  Negative for dysuria and hematuria.  Musculoskeletal: Positive for back pain.  Skin: Negative.  Negative for rash.  Neurological: Negative.  Negative for dizziness, tingling, sensory change, focal weakness, loss of consciousness and headaches.  All other systems reviewed and are negative.   Objective  Alert and oriented x3 in no apparent respiratory distress. Vitals as reported by the patient: Today's Vitals   01/11/20 1612  Weight: 300 lb (136.1 kg)  Height: 6\' 3"  (1.905 m)    There are no diagnoses linked to this encounter. Grigor was seen today for sciatica.  Diagnoses and all orders for this visit:  HNP (herniated nucleus pulposus), lumbar  New onset type 2 diabetes mellitus ([facility])  Sciatica of right side  [employer]'s short-term disability form filled out as requested. Work note clearing him for desk work only created.  Available on MyChart.   I discussed the assessment and treatment plan with the patient. The patient was provided an opportunity to ask questions and all were answered. The patient agreed with the plan and demonstrated an understanding of the instructions.   The patient was advised to call back or seek an in-person evaluation if the symptoms worsen or if the condition fails to improve as anticipated.  I provided 15 minutes of non-face-to-face time during this encounter.  Elita Quick, MD  Primary Care at [facility]

## 2020-01-12 ENCOUNTER — Telehealth: Payer: Self-pay | Admitting: Emergency Medicine

## 2020-01-12 ENCOUNTER — Telehealth: Payer: Self-pay | Admitting: *Deleted

## 2020-01-12 NOTE — Telephone Encounter (Signed)
Faxed completed and signed documents to [facility] Request Team. Confirmation page #1 received 12:46 pm and re faxed with return to work notes attached. Confirmation page # 4:59 pm.

## 2020-01-12 NOTE — Telephone Encounter (Signed)
Patients wife called regarding recent FMLA  paperwork. Paperwork was not signed and needs to be signed and also employer is asking for a start date and end . If not patient will not be allowed to come back to work tomorrow   Per clinical they will fax paperwork over again with supporting doc for work accommodations that ws sent to patients my chart as well

## 2020-01-13 ENCOUNTER — Encounter: Payer: Self-pay | Admitting: *Deleted

## 2020-01-13 NOTE — Progress Notes (Signed)
error 

## 2020-01-14 ENCOUNTER — Telehealth: Payer: Self-pay

## 2020-01-14 NOTE — Telephone Encounter (Signed)
NA

## 2020-03-16 ENCOUNTER — Ambulatory Visit (INDEPENDENT_AMBULATORY_CARE_PROVIDER_SITE_OTHER): Payer: Managed Care, Other (non HMO) | Admitting: Emergency Medicine

## 2020-03-16 ENCOUNTER — Other Ambulatory Visit: Payer: Self-pay

## 2020-03-16 ENCOUNTER — Encounter: Payer: Self-pay | Admitting: Emergency Medicine

## 2020-03-16 VITALS — BP 118/81 | HR 67 | Temp 98.2°F | Resp 16 | Ht 75.0 in | Wt 297.0 lb

## 2020-03-16 DIAGNOSIS — E785 Hyperlipidemia, unspecified: Secondary | ICD-10-CM | POA: Diagnosis not present

## 2020-03-16 DIAGNOSIS — E1165 Type 2 diabetes mellitus with hyperglycemia: Secondary | ICD-10-CM | POA: Diagnosis not present

## 2020-03-16 LAB — CBC WITH DIFFERENTIAL/PLATELET
Basophils Absolute: 0.1 10*3/uL (ref 0.0–0.2)
Basos: 1 %
EOS (ABSOLUTE): 0.1 10*3/uL (ref 0.0–0.4)
Eos: 1 %
Hematocrit: 50.4 % (ref 37.5–51.0)
Hemoglobin: 16.8 g/dL (ref 13.0–17.7)
Immature Grans (Abs): 0 10*3/uL (ref 0.0–0.1)
Immature Granulocytes: 0 %
Lymphocytes Absolute: 2.7 10*3/uL (ref 0.7–3.1)
Lymphs: 30 %
MCH: 30.1 pg (ref 26.6–33.0)
MCHC: 33.3 g/dL (ref 31.5–35.7)
MCV: 90 fL (ref 79–97)
Monocytes Absolute: 0.7 10*3/uL (ref 0.1–0.9)
Monocytes: 8 %
Neutrophils Absolute: 5.6 10*3/uL (ref 1.4–7.0)
Neutrophils: 60 %
Platelets: 203 10*3/uL (ref 150–450)
RBC: 5.58 x10E6/uL (ref 4.14–5.80)
RDW: 13 % (ref 11.6–15.4)
WBC: 9.2 10*3/uL (ref 3.4–10.8)

## 2020-03-16 LAB — COMPREHENSIVE METABOLIC PANEL
ALT: 58 IU/L — ABNORMAL HIGH (ref 0–44)
AST: 37 IU/L (ref 0–40)
Albumin/Globulin Ratio: 2.1 (ref 1.2–2.2)
Albumin: 4.6 g/dL (ref 4.0–5.0)
Alkaline Phosphatase: 59 IU/L (ref 48–121)
BUN/Creatinine Ratio: 15 (ref 9–20)
BUN: 16 mg/dL (ref 6–24)
Bilirubin Total: 0.5 mg/dL (ref 0.0–1.2)
CO2: 22 mmol/L (ref 20–29)
Calcium: 9.6 mg/dL (ref 8.7–10.2)
Chloride: 100 mmol/L (ref 96–106)
Creatinine, Ser: 1.07 mg/dL (ref 0.76–1.27)
GFR calc Af Amer: 96 mL/min/{1.73_m2} (ref >59)
GFR calc non Af Amer: 83 mL/min/{1.73_m2} (ref >59)
Globulin, Total: 2.2 g/dL (ref 1.5–4.5)
Glucose: 116 mg/dL — ABNORMAL HIGH (ref 65–99)
Potassium: 4.6 mmol/L (ref 3.5–5.2)
Sodium: 136 mmol/L (ref 134–144)
Total Protein: 6.8 g/dL (ref 6.0–8.5)

## 2020-03-16 LAB — POCT GLYCOSYLATED HEMOGLOBIN (HGB A1C): Hemoglobin A1C: 5.7 % — AB (ref 4.0–5.6)

## 2020-03-16 LAB — LIPID PANEL
Chol/HDL Ratio: 6.3 ratio — ABNORMAL HIGH (ref 0.0–5.0)
Cholesterol, Total: 200 mg/dL — ABNORMAL HIGH (ref 100–199)
HDL: 32 mg/dL — ABNORMAL LOW (ref >39)
LDL Chol Calc (NIH): 139 mg/dL — ABNORMAL HIGH (ref 0–99)
Triglycerides: 158 mg/dL — ABNORMAL HIGH (ref 0–149)
VLDL Cholesterol Cal: 29 mg/dL (ref 5–40)

## 2020-03-16 LAB — GLUCOSE, POCT (MANUAL RESULT ENTRY): POC Glucose: 105 mg/dl — AB (ref 70–99)

## 2020-03-16 MED ORDER — ROSUVASTATIN CALCIUM 10 MG PO TABS: 10.0000 mg | ORAL_TABLET | Freq: Every day | ORAL | 3 refills | Status: DC

## 2020-03-16 NOTE — Patient Instructions (Addendum)
Decrease glipizide to 5 mg once a day. Continue Metformin 1000 mg twice a day. Start rosuvastatin 10 mg daily. Follow-up in 6 months but earlier as needed.    If you have lab work done today you will be contacted with your lab results within the next 2 weeks.  If you have not heard from [place] then please contact us. The fastest way to get your results is to register for My Chart.   IF you received an x-ray today, you will receive an invoice from [facility]. Please contact [facility] at [phone] with questions or concerns regarding your invoice.   IF you received labwork today, you will receive an invoice from [facility]. Please contact [facility] at [phone] with questions or concerns regarding your invoice.   Our billing staff will not be able to assist you with questions regarding bills from these companies.  You will be contacted with the lab results as soon as they are available. The fastest way to get your results is to activate your My Chart account. Instructions are located on the last page of this paperwork. If you have not heard from [place] regarding the results in 2 weeks, please contact this office.     Diabetes Mellitus and Nutrition, Adult When you have diabetes (diabetes mellitus), it is very important to have healthy eating habits because your blood sugar (glucose) levels are greatly affected by what you eat and drink. Eating healthy foods in the appropriate amounts, at about the same times every day, can help you:  Control your blood glucose.  Lower your risk of heart disease.  Improve your blood pressure.  Reach or maintain a healthy weight. Every person with diabetes is different, and each person has different needs for a meal plan. Your health care provider may recommend that you work with a diet and nutrition specialist (dietitian) to make a meal plan that is best for you. Your meal plan may vary depending on factors such as:  The calories you  need.  The medicines you take.  Your weight.  Your blood glucose, blood pressure, and cholesterol levels.  Your activity level.  Other health conditions you have, such as heart or kidney disease. How do carbohydrates affect me? Carbohydrates, also called carbs, affect your blood glucose level more than any other type of food. Eating carbs naturally raises the amount of glucose in your blood. Carb counting is a method for keeping track of how many carbs you eat. Counting carbs is important to keep your blood glucose at a healthy level, especially if you use insulin or take certain oral diabetes medicines. It is important to know how many carbs you can safely have in each meal. This is different for every person. Your dietitian can help you calculate how many carbs you should have at each meal and for each snack. Foods that contain carbs include:  Bread, cereal, rice, pasta, and crackers.  Potatoes and corn.  Peas, beans, and lentils.  Milk and yogurt.  Fruit and juice.  Desserts, such as cakes, cookies, ice cream, and candy. How does alcohol affect me? Alcohol can cause a sudden decrease in blood glucose (hypoglycemia), especially if you use insulin or take certain oral diabetes medicines. Hypoglycemia can be a life-threatening condition. Symptoms of hypoglycemia (sleepiness, dizziness, and confusion) are similar to symptoms of having too much alcohol. If your health care provider says that alcohol is safe for you, follow these guidelines:  Limit alcohol intake to no more than 1 drink per  day for nonpregnant women and 2 drinks per day for men. One drink equals 12 oz of beer, 5 oz of wine, or 1 oz of hard liquor.  Do not drink on an empty stomach.  Keep yourself hydrated with water, diet soda, or unsweetened iced tea.  Keep in mind that regular soda, juice, and other mixers may contain a lot of sugar and must be counted as carbs. What are tips for following this plan?  Reading  food labels  Start by checking the serving size on the "Nutrition Facts" label of packaged foods and drinks. The amount of calories, carbs, fats, and other nutrients listed on the label is based on one serving of the item. Many items contain more than one serving per package.  Check the total grams (g) of carbs in one serving. You can calculate the number of servings of carbs in one serving by dividing the total carbs by 15. For example, if a food has 30 g of total carbs, it would be equal to 2 servings of carbs.  Check the number of grams (g) of saturated and trans fats in one serving. Choose foods that have low or no amount of these fats.  Check the number of milligrams (mg) of salt (sodium) in one serving. Most people should limit total sodium intake to less than 2,300 mg per day.  Always check the nutrition information of foods labeled as "low-fat" or "nonfat". These foods may be higher in added sugar or refined carbs and should be avoided.  Talk to your dietitian to identify your daily goals for nutrients listed on the label. Shopping  Avoid buying canned, premade, or processed foods. These foods tend to be high in fat, sodium, and added sugar.  Shop around the outside edge of the grocery store. This includes fresh fruits and vegetables, bulk grains, fresh meats, and fresh dairy. Cooking  Use low-heat cooking methods, such as baking, instead of high-heat cooking methods like deep frying.  Cook using healthy oils, such as olive, canola, or sunflower oil.  Avoid cooking with butter, cream, or high-fat meats. Meal planning  Eat meals and snacks regularly, preferably at the same times every day. Avoid going long periods of time without eating.  Eat foods high in fiber, such as fresh fruits, vegetables, beans, and whole grains. Talk to your dietitian about how many servings of carbs you can eat at each meal.  Eat 4-6 ounces (oz) of lean protein each day, such as lean meat, chicken,  fish, eggs, or tofu. One oz of lean protein is equal to: ? 1 oz of meat, chicken, or fish. ? 1 egg. ?  cup of tofu.  Eat some foods each day that contain healthy fats, such as avocado, nuts, seeds, and fish. Lifestyle  Check your blood glucose regularly.  Exercise regularly as told by your health care provider. This may include: ? 150 minutes of moderate-intensity or vigorous-intensity exercise each week. This could be brisk walking, biking, or water aerobics. ? Stretching and doing strength exercises, such as yoga or weightlifting, at least 2 times a week.  Take medicines as told by your health care provider.  Do not use any products that contain nicotine or tobacco, such as cigarettes and e-cigarettes. If you need help quitting, ask your health care provider.  Work with a Social worker or diabetes educator to identify strategies to manage stress and any emotional and social challenges. Questions to ask a health care provider  Do I need to meet with  a diabetes educator?  Do I need to meet with a dietitian?  What number can I call if I have questions?  When are the best times to check my blood glucose? Where to find more information:  American Diabetes Association: diabetes.org  Academy of Nutrition and Dietetics: www.eatright.[employer] of Diabetes and Digestive and Kidney Diseases (NIH): CarFli[employer].tn Summary  A healthy meal plan will help you control your blood glucose and maintain a healthy lifestyle.  Working with a diet and nutrition specialist (dietitian) can help you make a meal plan that is best for you.  Keep in mind that carbohydrates (carbs) and alcohol have immediate effects on your blood glucose levels. It is important to count carbs and to use alcohol carefully. This information is not intended to replace advice given to you by your health care provider. Make sure you discuss any questions you have with your health care provider. Document  Revised: 08/23/2017 Document Reviewed: 10/15/2016 Elsevier Patient Education  2020 [employer].

## 2020-03-16 NOTE — Assessment & Plan Note (Signed)
Much improved diabetes with hemoglobin A1c of 5.7.  Continue Metformin 1000 mg twice a day.  Due to hypoglycemia episodes will decrease glipizide to 5 mg in the morning only.  Diet and nutrition discussed. We will start rosuvastatin 10 mg daily. Smoking counseling given. Follow-up in 6 months.

## 2020-03-16 NOTE — Progress Notes (Addendum)
Tyler Atkins 44 y.o.   Chief Complaint  Patient presents with  . Diabetes    FOLLOW UP 3 MONTH    HISTORY OF PRESENT ILLNESS: This is a [age] y.o. male with history of diabetes here for follow-up Lab Results  Component Value Date   HGBA1C 8.3 (H) 12/15/2019  Presently taking Metformin 1000 mg twice a day and glipizide 5 mg twice a day.  Reports episodes of hypoglycemia in the afternoons. No other complaints or medical concerns today. The 10-year ASCVD risk score Denman George(Goff DC Montez HagemanJr., et al., 2013) is: 14.2%   Values used to calculate the score:     Age: [age] years     Sex: Male     Is Non-Hispanic African American: No     Diabetic: Yes     Tobacco smoker: Yes     Systolic Blood Pressure: 118 mmHg     Is BP treated: No     HDL Cholesterol: 34 mg/dL     Total Cholesterol: 194 mg/dL Lab Results  Component Value Date   CHOL 194 10/01/2017   HDL 34 (L) 10/01/2017   LDLCALC 127 (H) 10/01/2017   TRIG 163 (H) 10/01/2017   CHOLHDL 5.7 (H) 10/01/2017      HPI   Prior to Admission medications   Medication Sig Start Date End Date Taking? Authorizing Provider  metFORMIN (GLUCOPHAGE) 1000 MG tablet Take 1 tablet (1,000 mg total) by mouth 2 (two) times daily with a meal. 12/16/19  Yes Mekiah Wahler, Eilleen KempfMiguel Maeson, MD  cyclobenzaprine (FLEXERIL) 10 MG tablet Take 1 tablet (10 mg total) by mouth 3 (three) times daily as needed for muscle spasms. Patient not taking: Reported on 03/16/2020 12/07/19   Hilts, Casimiro NeedleMichael, MD  glipiZIDE (GLUCOTROL) 5 MG tablet Take 1 tablet (5 mg total) by mouth 2 (two) times daily with a meal. 12/16/19 03/15/20  Georgina QuintSagardia, Lazar Tierce Laurent, MD    No Known Allergies  Patient Active Problem List   Diagnosis Date Noted  . New onset type 2 diabetes mellitus (HCC) 12/16/2019    Past Medical History:  Diagnosis Date  . Depression   . Headache    MIGRAINES  . Pre-diabetes   . PTSD (post-traumatic stress disorder)   . UTI (lower urinary tract infection)     Past Surgical History:    Procedure Laterality Date  . CERVICAL DISC ARTHROPLASTY    . TONSILLECTOMY      Social History   Socioeconomic History  . Marital status: Married    Spouse name: Not on file  . Number of children: Not on file  . Years of education: Not on file  . Highest education level: Not on file  Occupational History  . Not on file  Tobacco Use  . Smoking status: Current Every Day Smoker    Packs/day: 1.00    Types: Cigarettes  . Smokeless tobacco: Never Used  Vaping Use  . Vaping Use: Never used  Substance and Sexual Activity  . Alcohol use: Yes    Comment: 1-2x/month  . Drug use: No  . Sexual activity: Not on file  Other Topics Concern  . Not on file  Social History Narrative  . Not on file   Social Determinants of Health   Financial Resource Strain:   . Difficulty of Paying Living Expenses:   Food Insecurity:   . Worried About Programme researcher, broadcasting/film/videounning Out of Food in the Last Year:   . Baristaan Out of Food in the Last Year:   Transportation Needs:   .  Lack of Transportation (Medical):   Marland Kitchen Lack of Transportation (Non-Medical):   Physical Activity:   . Days of Exercise per Week:   . Minutes of Exercise per Session:   Stress:   . Feeling of Stress :   Social Connections:   . Frequency of Communication with Friends and Family:   . Frequency of Social Gatherings with Friends and Family:   . Attends Religious Services:   . Active Member of Clubs or Organizations:   . Attends Banker Meetings:   Marland Kitchen Marital Status:   Intimate Partner Violence:   . Fear of Current or Ex-Partner:   . Emotionally Abused:   Marland Kitchen Physically Abused:   . Sexually Abused:     Family History  Problem Relation Age of Onset  . Cancer Mother   . Heart disease Father      Review of Systems  Constitutional: Negative.  Negative for chills and fever.  HENT: Negative.  Negative for congestion and sore throat.   Respiratory: Negative.  Negative for cough and shortness of breath.   Cardiovascular: Negative.   Negative for chest pain and palpitations.  Gastrointestinal: Negative.  Negative for abdominal pain, diarrhea, nausea and vomiting.  Genitourinary: Negative.  Negative for dysuria and hematuria.  Musculoskeletal: Positive for back pain. Negative for myalgias and neck pain.  Skin: Negative.  Negative for rash.  Neurological: Negative.  Negative for dizziness and headaches.  Endo/Heme/Allergies: Negative.   All other systems reviewed and are negative.  Vitals:   03/16/20 0938  BP: 118/81  Pulse: 67  Resp: 16  Temp: 98.2 F (36.8 C)  SpO2: 97%     Physical Exam Vitals reviewed.  Constitutional:      Appearance: Normal appearance.  HENT:     Head: Normocephalic.  Eyes:     Extraocular Movements: Extraocular movements intact.     Conjunctiva/sclera: Conjunctivae normal.     Pupils: Pupils are equal, round, and reactive to light.  Cardiovascular:     Rate and Rhythm: Normal rate and regular rhythm.     Pulses: Normal pulses.     Heart sounds: Normal heart sounds.  Pulmonary:     Effort: Pulmonary effort is normal.     Breath sounds: Normal breath sounds.  Abdominal:     General: Bowel sounds are normal. There is no distension.     Palpations: Abdomen is soft.     Tenderness: There is no abdominal tenderness.  Musculoskeletal:        General: Normal range of motion.     Cervical back: Normal range of motion and neck supple.  Skin:    General: Skin is warm and dry.     Capillary Refill: Capillary refill takes less than 2 seconds.  Neurological:     General: No focal deficit present.     Mental Status: He is alert and oriented to person, place, and time.  Psychiatric:        Mood and Affect: Mood normal.        Behavior: Behavior normal.     Results for orders placed or performed in visit on 03/16/20 (from the past 24 hour(s))  POCT glucose (manual entry)     Status: Abnormal   Collection Time: 03/16/20  9:52 AM  Result Value Ref Range   POC Glucose 105 (A) 70 - 99  mg/dl  POCT glycosylated hemoglobin (Hb A1C)     Status: Abnormal   Collection Time: 03/16/20  9:57 AM  Result Value Ref  Range   Hemoglobin A1C 5.7 (A) 4.0 - 5.6 %   HbA1c POC (<> result, manual entry)     HbA1c, POC (prediabetic range)     HbA1c, POC (controlled diabetic range)     A total of 30 minutes was spent with the patient, greater than 50% of which was in counseling/coordination of care regarding diabetes and cardiovascular risk associated with this condition, dyslipidemia and need for statin therapy, review of most recent blood work results including today's hemoglobin A1c, review and change of medications, hypoglycemia precautions, diet and nutrition, prognosis and need for follow-up.   Medically clear for lumbar spine surgery.  ASSESSMENT & PLAN: New onset type 2 diabetes mellitus (HCC) Much improved diabetes with hemoglobin A1c of 5.7.  Continue Metformin 1000 mg twice a day.  Due to hypoglycemia episodes will decrease glipizide to 5 mg in the morning only.  Diet and nutrition discussed. We will start rosuvastatin 10 mg daily. Smoking counseling given. Follow-up in 6 months.  Kristapher was seen today for diabetes.  Diagnoses and all orders for this visit:  Type 2 diabetes mellitus with hyperglycemia, without long-term current use of insulin (HCC) -     POCT glucose (manual entry) -     POCT glycosylated hemoglobin (Hb A1C) -     CBC with Differential/Platelet -     Comprehensive metabolic panel -     Lipid panel  Dyslipidemia -     rosuvastatin (CRESTOR) 10 MG tablet; Take 1 tablet (10 mg total) by mouth daily.    Patient Instructions    Decrease glipizide to 5 mg once a day. Continue Metformin 1000 mg twice a day. Start rosuvastatin 10 mg daily. Follow-up in 6 months but earlier as needed.    If you have lab work done today you will be contacted with your lab results within the next 2 weeks.  If you have not heard from [place] then please contact us. The fastest way  to get your results is to register for My Chart.   IF you received an x-ray today, you will receive an invoice from [facility]. Please contact [facility] at [phone] with questions or concerns regarding your invoice.   IF you received labwork today, you will receive an invoice from [facility]. Please contact [facility] at [phone] with questions or concerns regarding your invoice.   Our billing staff will not be able to assist you with questions regarding bills from these companies.  You will be contacted with the lab results as soon as they are available. The fastest way to get your results is to activate your My Chart account. Instructions are located on the last page of this paperwork. If you have not heard from [place] regarding the results in 2 weeks, please contact this office.     Diabetes Mellitus and Nutrition, Adult When you have diabetes (diabetes mellitus), it is very important to have healthy eating habits because your blood sugar (glucose) levels are greatly affected by what you eat and drink. Eating healthy foods in the appropriate amounts, at about the same times every day, can help you:  Control your blood glucose.  Lower your risk of heart disease.  Improve your blood pressure.  Reach or maintain a healthy weight. Every person with diabetes is different, and each person has different needs for a meal plan. Your health care provider may recommend that you work with a diet and nutrition specialist (dietitian) to make a meal plan that is best for you. Your  meal plan may vary depending on factors such as:  The calories you need.  The medicines you take.  Your weight.  Your blood glucose, blood pressure, and cholesterol levels.  Your activity level.  Other health conditions you have, such as heart or kidney disease. How do carbohydrates affect me? Carbohydrates, also called carbs, affect your blood glucose level more than any other type of food.  Eating carbs naturally raises the amount of glucose in your blood. Carb counting is a method for keeping track of how many carbs you eat. Counting carbs is important to keep your blood glucose at a healthy level, especially if you use insulin or take certain oral diabetes medicines. It is important to know how many carbs you can safely have in each meal. This is different for every person. Your dietitian can help you calculate how many carbs you should have at each meal and for each snack. Foods that contain carbs include:  Bread, cereal, rice, pasta, and crackers.  Potatoes and corn.  Peas, beans, and lentils.  Milk and yogurt.  Fruit and juice.  Desserts, such as cakes, cookies, ice cream, and candy. How does alcohol affect me? Alcohol can cause a sudden decrease in blood glucose (hypoglycemia), especially if you use insulin or take certain oral diabetes medicines. Hypoglycemia can be a life-threatening condition. Symptoms of hypoglycemia (sleepiness, dizziness, and confusion) are similar to symptoms of having too much alcohol. If your health care provider says that alcohol is safe for you, follow these guidelines:  Limit alcohol intake to no more than 1 drink per day for nonpregnant women and 2 drinks per day for men. One drink equals 12 oz of beer, 5 oz of wine, or 1 oz of hard liquor.  Do not drink on an empty stomach.  Keep yourself hydrated with water, diet soda, or unsweetened iced tea.  Keep in mind that regular soda, juice, and other mixers may contain a lot of sugar and must be counted as carbs. What are tips for following this plan?  Reading food labels  Start by checking the serving size on the "Nutrition Facts" label of packaged foods and drinks. The amount of calories, carbs, fats, and other nutrients listed on the label is based on one serving of the item. Many items contain more than one serving per package.  Check the total grams (g) of carbs in one serving. You can  calculate the number of servings of carbs in one serving by dividing the total carbs by 15. For example, if a food has 30 g of total carbs, it would be equal to 2 servings of carbs.  Check the number of grams (g) of saturated and trans fats in one serving. Choose foods that have low or no amount of these fats.  Check the number of milligrams (mg) of salt (sodium) in one serving. Most people should limit total sodium intake to less than 2,300 mg per day.  Always check the nutrition information of foods labeled as "low-fat" or "nonfat". These foods may be higher in added sugar or refined carbs and should be avoided.  Talk to your dietitian to identify your daily goals for nutrients listed on the label. Shopping  Avoid buying canned, premade, or processed foods. These foods tend to be high in fat, sodium, and added sugar.  Shop around the outside edge of the grocery store. This includes fresh fruits and vegetables, bulk grains, fresh meats, and fresh dairy. Cooking  Use low-heat cooking methods, such as  baking, instead of high-heat cooking methods like deep frying.  Cook using healthy oils, such as olive, canola, or sunflower oil.  Avoid cooking with butter, cream, or high-fat meats. Meal planning  Eat meals and snacks regularly, preferably at the same times every day. Avoid going long periods of time without eating.  Eat foods high in fiber, such as fresh fruits, vegetables, beans, and whole grains. Talk to your dietitian about how many servings of carbs you can eat at each meal.  Eat 4-6 ounces (oz) of lean protein each day, such as lean meat, chicken, fish, eggs, or tofu. One oz of lean protein is equal to: ? 1 oz of meat, chicken, or fish. ? 1 egg. ?  cup of tofu.  Eat some foods each day that contain healthy fats, such as avocado, nuts, seeds, and fish. Lifestyle  Check your blood glucose regularly.  Exercise regularly as told by your health care provider. This may  include: ? 150 minutes of moderate-intensity or vigorous-intensity exercise each week. This could be brisk walking, biking, or water aerobics. ? Stretching and doing strength exercises, such as yoga or weightlifting, at least 2 times a week.  Take medicines as told by your health care provider.  Do not use any products that contain nicotine or tobacco, such as cigarettes and e-cigarettes. If you need help quitting, ask your health care provider.  Work with a Veterinary surgeon or diabetes educator to identify strategies to manage stress and any emotional and social challenges. Questions to ask a health care provider  Do I need to meet with a diabetes educator?  Do I need to meet with a dietitian?  What number can I call if I have questions?  When are the best times to check my blood glucose? Where to find more information:  American Diabetes Association: diabetes.org  Academy of Nutrition and Dietetics: www.eatright.[employer] of Diabetes and Digestive and Kidney Diseases (NIH): CarFlippers.tn Summary  A healthy meal plan will help you control your blood glucose and maintain a healthy lifestyle.  Working with a diet and nutrition specialist (dietitian) can help you make a meal plan that is best for you.  Keep in mind that carbohydrates (carbs) and alcohol have immediate effects on your blood glucose levels. It is important to count carbs and to use alcohol carefully. This information is not intended to replace advice given to you by your health care provider. Make sure you discuss any questions you have with your health care provider. Document Revised: 08/23/2017 Document Reviewed: 10/15/2016 Elsevier Patient Education  2020 Elsevier Inc.      Edwina Barth, MD [facility]

## 2020-03-22 ENCOUNTER — Encounter: Payer: Self-pay | Admitting: Family Medicine

## 2020-03-22 ENCOUNTER — Encounter: Payer: Self-pay | Admitting: Orthopaedic Surgery

## 2020-03-23 ENCOUNTER — Telehealth: Payer: Self-pay | Admitting: Emergency Medicine

## 2020-03-23 ENCOUNTER — Other Ambulatory Visit: Payer: Self-pay

## 2020-03-23 ENCOUNTER — Telehealth: Payer: Self-pay | Admitting: *Deleted

## 2020-03-23 NOTE — Telephone Encounter (Signed)
Please F/U with this.  Thank you  

## 2020-03-23 NOTE — Telephone Encounter (Signed)
Pt wife called and was wondering about surgery clearance paper work for surgery coming up on 04/01/20. Pt wife stated that she will call surgans office to have them fax paperwork over for provider to sign. Please advise.

## 2020-03-23 NOTE — Telephone Encounter (Signed)
Patient contacted about pre-op form and letter faxed to Ortho provider for surgery.

## 2020-03-23 NOTE — Telephone Encounter (Signed)
Spoke to patient to let him know the pre-op form with letter was faxed today Attn: Debbie.

## 2020-03-23 NOTE — Telephone Encounter (Signed)
Received a fax of  surgery clearancepaperwork. Paperwork was placed in providers box beside nurses station. Please advise.

## 2020-03-24 ENCOUNTER — Encounter: Payer: Self-pay | Admitting: Surgery

## 2020-03-24 ENCOUNTER — Ambulatory Visit (INDEPENDENT_AMBULATORY_CARE_PROVIDER_SITE_OTHER): Payer: Managed Care, Other (non HMO) | Admitting: Surgery

## 2020-03-24 VITALS — BP 131/84 | HR 77 | Temp 98.1°F

## 2020-03-24 DIAGNOSIS — M5126 Other intervertebral disc displacement, lumbar region: Secondary | ICD-10-CM

## 2020-03-24 NOTE — Progress Notes (Signed)
44 year old male with history of right L5-S1 HNP, low back pain and right lower extremity radiculopathy with leg weakness comes in for preop evaluation.  States that symptoms unchanged from previous visit and he is wanting to proceed with right L5-S1 microdiscectomy as scheduled.  We have received preop medical clearance.  History and physical performed.  Review of systems negative.  On exam he has trace right anterior tib and gastroc weakness.  Pain with right straight leg raise.  Patient is wanting to proceed with surgery.  All questions answered.

## 2020-03-29 NOTE — Progress Notes (Signed)
[id number] [place], [place] - [address] [place] [address] Phone: [phone] Fax: [phone]    Your procedure is scheduled on Friday, July 9th.  Report to [facility] Main Entrance "A" at 7:30 A.M., and check in at the Admitting office.  Call this number if you have problems the morning of surgery:  [phone]  Call [phone] if you have any questions prior to your surgery date Monday-Friday 8am-4pm   Remember:  Do not eat after midnight the night before your surgery  You may drink clear liquids until 6:30 A.M. the morning of your surgery.   Clear liquids allowed are: Water, Non-Citrus Juices (without pulp), Carbonated Beverages, Clear Tea, Black Coffee Only, and Gatorade    Take these medicines the morning of surgery with A SIP OF WATER  rosuvastatin (CRESTOR)  If needed- cyclobenzaprine (FLEXERIL)  As of today, STOP taking any Aspirin (unless otherwise instructed by your surgeon) Aleve, Naproxen, Ibuprofen, Motrin, Advil, Goody's, BC's, all herbal medications, fish oil, and all vitamins.  WHAT DO I DO ABOUT MY DIABETES MEDICATION?  -The morning of surgery Do NOT take glipiZIDE (GLUCOTROL) or metFORMIN (GLUCOPHAGE)  -The night before surgery Do NOT take glipiZIDE (GLUCOTROL) if normally taken in the evening  . Do not take oral diabetes medicines (pills) the morning of surgery.  HOW TO MANAGE YOUR DIABETES BEFORE AND AFTER SURGERY  Why is it important to control my blood sugar before and after surgery? . Improving blood sugar levels before and after surgery helps healing and can limit problems. . A way of improving blood sugar control is eating a healthy diet by: o  Eating less sugar and carbohydrates o  Increasing activity/exercise o  Talking with your doctor about reaching your blood sugar goals . High blood sugars (greater than 180 mg/dL) can raise your risk of infections and slow your recovery, so you will need to  focus on controlling your diabetes during the weeks before surgery. . Make sure that the doctor who takes care of your diabetes knows about your planned surgery including the date and location.  How do I manage my blood sugar before surgery? . Check your blood sugar at least 4 times a day, starting 2 days before surgery, to make sure that the level is not too high or low. . Check your blood sugar the morning of your surgery when you wake up and every 2 hours until you get to the Short Stay unit. o If your blood sugar is less than 70 mg/dL, you will need to treat for low blood sugar: - Do not take insulin. - Treat a low blood sugar (less than 70 mg/dL) with  cup of clear juice (cranberry or apple), 4 glucose tablets, OR glucose gel. - Recheck blood sugar in 15 minutes after treatment (to make sure it is greater than 70 mg/dL). If your blood sugar is not greater than 70 mg/dL on recheck, call [phone] for further instructions. . Report your blood sugar to the short stay nurse when you get to Short Stay.  . If you are admitted to the hospital after surgery: o Your blood sugar will be checked by the staff and you will probably be given insulin after surgery (instead of oral diabetes medicines) to make sure you have good blood sugar levels. o The goal for blood sugar control after surgery is 80-180 mg/dL.             Do not wear jewelry.  Do not wear lotions, powders, colognes, or deodorant.            Men may shave face and neck.            Do not bring valuables to the hospital.            [facility] is not responsible for any belongings or valuables.  Do NOT Smoke (Tobacco/Vaping) or drink Alcohol 24 hours prior to your procedure If you use a CPAP at night, you may bring all equipment for your overnight stay.   Contacts, glasses, dentures or bridgework may not be worn into surgery.      For patients admitted to the hospital, discharge time will be determined by your treatment  team.   Patients discharged the day of surgery will not be allowed to drive home, and someone needs to stay with them for 24 hours.  Special instructions:   [place]- Preparing For Surgery  Before surgery, you can play an important role. Because skin is not sterile, your skin needs to be as free of germs as possible. You can reduce the number of germs on your skin by washing with CHG (chlorahexidine gluconate) Soap before surgery.  CHG is an antiseptic cleaner which kills germs and bonds with the skin to continue killing germs even after washing.    Oral Hygiene is also important to reduce your risk of infection.  Remember - BRUSH YOUR TEETH THE MORNING OF SURGERY WITH YOUR REGULAR TOOTHPASTE  Please do not use if you have an allergy to CHG or antibacterial soaps. If your skin becomes reddened/irritated stop using the CHG.  Do not shave (including legs and underarms) for at least 48 hours prior to first CHG shower. It is OK to shave your face.  Please follow these instructions carefully.   1. Shower the NIGHT BEFORE SURGERY and the MORNING OF SURGERY with CHG Soap.   2. If you chose to wash your hair, wash your hair first as usual with your normal shampoo.  3. After you shampoo, rinse your hair and body thoroughly to remove the shampoo.  4. Use CHG as you would any other liquid soap. You can apply CHG directly to the skin and wash gently with a scrungie or a clean washcloth.   5. Apply the CHG Soap to your body ONLY FROM THE NECK DOWN.  Do not use on open wounds or open sores. Avoid contact with your eyes, ears, mouth and genitals (private parts). Wash Face and genitals (private parts)  with your normal soap.   6. Wash thoroughly, paying special attention to the area where your surgery will be performed.  7. Thoroughly rinse your body with warm water from the neck down.  8. DO NOT shower/wash with your normal soap after using and rinsing off the CHG Soap.  9. Pat yourself dry with  a CLEAN TOWEL.  10. Wear CLEAN PAJAMAS to bed the night before surgery  11. Place CLEAN SHEETS on your bed the night of your first shower and DO NOT SLEEP WITH PETS.  Day of Surgery: Wear Clean/Comfortable clothing the morning of surgery Do not apply any deodorants/lotions.   Remember to brush your teeth WITH YOUR REGULAR TOOTHPASTE.   Please read over the following fact sheets that you were given.

## 2020-03-30 ENCOUNTER — Other Ambulatory Visit: Payer: Self-pay

## 2020-03-30 ENCOUNTER — Encounter (HOSPITAL_COMMUNITY)
Admission: RE | Admit: 2020-03-30 | Discharge: 2020-03-30 | Disposition: A | Discharge status: Home / Self Care | Payer: Managed Care, Other (non HMO) | Source: Ambulatory Visit | Attending: Orthopaedic Surgery | Admitting: Orthopaedic Surgery

## 2020-03-30 ENCOUNTER — Encounter (HOSPITAL_COMMUNITY): Payer: Self-pay

## 2020-03-30 ENCOUNTER — Other Ambulatory Visit (HOSPITAL_COMMUNITY)
Admission: RE | Admit: 2020-03-30 | Discharge: 2020-03-30 | Disposition: A | Discharge status: Home / Self Care | Payer: Managed Care, Other (non HMO) | Source: Ambulatory Visit | Attending: Orthopaedic Surgery | Admitting: Orthopaedic Surgery

## 2020-03-30 DIAGNOSIS — Z01812 Encounter for preprocedural laboratory examination: Secondary | ICD-10-CM | POA: Diagnosis not present

## 2020-03-30 DIAGNOSIS — Z20822 Contact with and (suspected) exposure to covid-19: Secondary | ICD-10-CM | POA: Diagnosis not present

## 2020-03-30 LAB — COMPREHENSIVE METABOLIC PANEL
ALT: 56 U/L — ABNORMAL HIGH (ref 0–44)
AST: 36 U/L (ref 15–41)
Albumin: 4.1 g/dL (ref 3.5–5.0)
Alkaline Phosphatase: 46 U/L (ref 38–126)
Anion gap: 10 (ref 5–15)
BUN: 12 mg/dL (ref 6–20)
CO2: 23 mmol/L (ref 22–32)
Calcium: 9.2 mg/dL (ref 8.9–10.3)
Chloride: 105 mmol/L (ref 98–111)
Creatinine, Ser: 0.86 mg/dL (ref 0.61–1.24)
GFR calc Af Amer: >60 mL/min (ref >60)
GFR calc non Af Amer: >60 mL/min (ref >60)
Glucose, Bld: 124 mg/dL — ABNORMAL HIGH (ref 70–99)
Potassium: 4.2 mmol/L (ref 3.5–5.1)
Sodium: 138 mmol/L (ref 135–145)
Total Bilirubin: 0.5 mg/dL (ref 0.3–1.2)
Total Protein: 6.7 g/dL (ref 6.5–8.1)

## 2020-03-30 LAB — CBC
HCT: 47.4 % (ref 39.0–52.0)
Hemoglobin: 16.6 g/dL (ref 13.0–17.0)
MCH: 30.9 pg (ref 26.0–34.0)
MCHC: 35 g/dL (ref 30.0–36.0)
MCV: 88.1 fL (ref 80.0–100.0)
Platelets: 201 10*3/uL (ref 150–400)
RBC: 5.38 MIL/uL (ref 4.22–5.81)
RDW: 12.5 % (ref 11.5–15.5)
WBC: 9.9 10*3/uL (ref 4.0–10.5)
nRBC: 0 % (ref 0.0–0.2)

## 2020-03-30 LAB — SURGICAL PCR SCREEN
MRSA, PCR: NEGATIVE
Staphylococcus aureus: NEGATIVE

## 2020-03-30 LAB — GLUCOSE, CAPILLARY: Glucose-Capillary: 125 mg/dL — ABNORMAL HIGH (ref 70–99)

## 2020-03-30 LAB — SARS CORONAVIRUS 2 (TAT 6-24 HRS): SARS Coronavirus 2: NEGATIVE

## 2020-03-30 NOTE — Progress Notes (Signed)
PCP - Georgina Quint, MD Cardiologist - Denies  PPM/ICD - Denies  Chest x-ray - 07/07/19 EKG - 07/07/19 Stress Test - Denies ECHO - Denies Cardiac Cath - Denies  Sleep Study - Denies  Fasting Blood Sugar: ~100  Checks Blood Sugar 2 times a day. CBG at PAT appointment was 125.  A1C 03/16/20 was 5.7.  Blood Thinner Instructions: N/A Aspirin Instructions: N/A  ERAS Protcol - Yes PRE-SURGERY Ensure or G2- G2 given  COVID TEST- 03/30/20   Anesthesia review: No  Patient denies shortness of breath, fever, cough and chest pain at PAT appointment   All instructions explained to the patient, with a verbal understanding of the material. Patient agrees to go over the instructions while at home for a better understanding. Patient also instructed to self quarantine after being tested for COVID-19. The opportunity to ask questions was provided.

## 2020-03-30 NOTE — Progress Notes (Signed)
Your procedure is scheduled on Friday, July 9th.  Report to [facility] Main Entrance "A" at 7:30 A.M., and check in at the Admitting office.  Call this number if you have problems the morning of surgery:  [phone]  Call [phone] if you have any questions prior to your surgery date Monday-Friday 8am-4pm   Remember:  Do not eat after midnight the night before your surgery.  You may drink clear liquids until 6:30 A.M. the morning of your surgery.   Clear liquids allowed are: Water, Non-Citrus Juices (without pulp), Carbonated Beverages, Clear Tea, Black Coffee Only, and Gatorade.  Please complete your PRE-SURGERY G2 that was provided to you by 06:30 AM the morning of surgery.  Please, if able, drink it in one setting. DO NOT SIP.     Take these medicines the morning of surgery with A SIP OF WATER  rosuvastatin (CRESTOR)  If needed- cyclobenzaprine (FLEXERIL)  As of today, STOP taking any Aspirin (unless otherwise instructed by your surgeon) Aleve, Naproxen, Ibuprofen, Motrin, Advil, Goody's, BC's, all herbal medications, fish oil, and all vitamins.  WHAT DO I DO ABOUT MY DIABETES MEDICATION?  -The morning of surgery Do NOT take glipiZIDE (GLUCOTROL) or metFORMIN (GLUCOPHAGE)  -The night before surgery Do NOT take glipiZIDE (GLUCOTROL) if normally taken in the evening  . Do not take oral diabetes medicines (pills) the morning of surgery.  HOW TO MANAGE YOUR DIABETES BEFORE AND AFTER SURGERY  Why is it important to control my blood sugar before and after surgery? . Improving blood sugar levels before and after surgery helps healing and can limit problems. . A way of improving blood sugar control is eating a healthy diet by: o  Eating less sugar and carbohydrates o  Increasing activity/exercise o  Talking with your doctor about reaching your blood sugar goals . High blood sugars (greater than 180 mg/dL) can raise your risk of infections and slow your recovery, so you  will need to focus on controlling your diabetes during the weeks before surgery. . Make sure that the doctor who takes care of your diabetes knows about your planned surgery including the date and location.  How do I manage my blood sugar before surgery? . Check your blood sugar at least 4 times a day, starting 2 days before surgery, to make sure that the level is not too high or low. . Check your blood sugar the morning of your surgery when you wake up and every 2 hours until you get to the Short Stay unit. o If your blood sugar is less than 70 mg/dL, you will need to treat for low blood sugar: - Do not take insulin. - Treat a low blood sugar (less than 70 mg/dL) with  cup of clear juice (cranberry or apple), 4 glucose tablets, OR glucose gel. - Recheck blood sugar in 15 minutes after treatment (to make sure it is greater than 70 mg/dL). If your blood sugar is not greater than 70 mg/dL on recheck, call [phone] for further instructions. . Report your blood sugar to the short stay nurse when you get to Short Stay.  . If you are admitted to the hospital after surgery: o Your blood sugar will be checked by the staff and you will probably be given insulin after surgery (instead of oral diabetes medicines) to make sure you have good blood sugar levels. o The goal for blood sugar control after surgery is 80-180 mg/dL.  Do not wear jewelry.            Do not wear lotions, powders, colognes, or deodorant.            Men may shave face and neck.            Do not bring valuables to the hospital.            [facility] is not responsible for any belongings or valuables.  Do NOT Smoke (Tobacco/Vaping) or drink Alcohol 24 hours prior to your procedure If you use a CPAP at night, you may bring all equipment for your overnight stay.   Contacts, glasses, dentures or bridgework may not be worn into surgery.      For patients admitted to the hospital, discharge time will be determined by  your treatment team.   Patients discharged the day of surgery will not be allowed to drive home, and someone needs to stay with them for 24 hours.  Special instructions:   [place]- Preparing For Surgery  Before surgery, you can play an important role. Because skin is not sterile, your skin needs to be as free of germs as possible. You can reduce the number of germs on your skin by washing with CHG (chlorahexidine gluconate) Soap before surgery.  CHG is an antiseptic cleaner which kills germs and bonds with the skin to continue killing germs even after washing.    Oral Hygiene is also important to reduce your risk of infection.  Remember - BRUSH YOUR TEETH THE MORNING OF SURGERY WITH YOUR REGULAR TOOTHPASTE  Please do not use if you have an allergy to CHG or antibacterial soaps. If your skin becomes reddened/irritated stop using the CHG.  Do not shave (including legs and underarms) for at least 48 hours prior to first CHG shower. It is OK to shave your face.  Please follow these instructions carefully.   1. Shower the NIGHT BEFORE SURGERY and the MORNING OF SURGERY with CHG Soap.   2. If you chose to wash your hair, wash your hair first as usual with your normal shampoo.  3. After you shampoo, rinse your hair and body thoroughly to remove the shampoo.  4. Use CHG as you would any other liquid soap. You can apply CHG directly to the skin and wash gently with a scrungie or a clean washcloth.   5. Apply the CHG Soap to your body ONLY FROM THE NECK DOWN.  Do not use on open wounds or open sores. Avoid contact with your eyes, ears, mouth and genitals (private parts). Wash Face and genitals (private parts)  with your normal soap.   6. Wash thoroughly, paying special attention to the area where your surgery will be performed.  7. Thoroughly rinse your body with warm water from the neck down.  8. DO NOT shower/wash with your normal soap after using and rinsing off the CHG Soap.  9. Pat  yourself dry with a CLEAN TOWEL.  10. Wear CLEAN PAJAMAS to bed the night before surgery  11. Place CLEAN SHEETS on your bed the night of your first shower and DO NOT SLEEP WITH PETS.  Day of Surgery: Wear Clean/Comfortable clothing the morning of surgery Do not apply any deodorants/lotions.   Remember to brush your teeth WITH YOUR REGULAR TOOTHPASTE.   Please read over the following fact sheets that you were given.

## 2020-03-31 MED ORDER — DEXTROSE 5 % IV SOLN
3.0000 g | INTRAVENOUS | Status: AC
  Administered 2020-04-01: 3 g via INTRAVENOUS
  Filled 2020-03-31: qty 3

## 2020-03-31 NOTE — H&P (Signed)
Tyler Atkins is an 44 y.o. male.   Chief Complaint: Low back pain and right lower extremity radiculopathy HPI: 44 year old male with history of right L5-S1 HNP, low back pain and right lower extremity radiculopathy with leg weakness comes in for preop evaluation.  States that symptoms unchanged from previous visit and he is wanting to proceed with right L5-S1 microdiscectomy as scheduled.  We have received preop medical clearance.  Past Medical History:  Diagnosis Date  . Depression   . Headache    MIGRAINES  . Pre-diabetes   . PTSD (post-traumatic stress disorder)   . UTI (lower urinary tract infection)     Past Surgical History:  Procedure Laterality Date  . CERVICAL DISC ARTHROPLASTY    . TONSILLECTOMY      Family History  Problem Relation Age of Onset  . Cancer Mother   . Heart disease Father    Social History:  reports that he has been smoking cigarettes. He has been smoking about 1.00 pack per day. He has never used smokeless tobacco. He reports current alcohol use. He reports that he does not use drugs.  Allergies: No Known Allergies  No medications prior to admission.    Results for orders placed or performed during the hospital encounter of 03/30/20 (from the past 48 hour(s))  SARS CORONAVIRUS 2 (TAT 6-24 HRS) Nasopharyngeal Nasopharyngeal Swab     Status: None   Collection Time: 03/30/20  9:32 AM   Specimen: Nasopharyngeal Swab  Result Value Ref Range   SARS Coronavirus 2 NEGATIVE NEGATIVE    Comment: (NOTE) SARS-CoV-2 target nucleic acids are NOT DETECTED.  The SARS-CoV-2 RNA is generally detectable in upper and lower respiratory specimens during the acute phase of infection. Negative results do not preclude SARS-CoV-2 infection, do not rule out co-infections with other pathogens, and should not be used as the sole basis for treatment or other patient management decisions. Negative results must be combined with clinical observations, patient history, and  epidemiological information. The expected result is Negative.  Fact Sheet for Patients: HairSlick.no  Fact Sheet for Healthcare Providers: quierodirigir.com  This test is not yet approved or cleared by the [place] FDA and  has been authorized for detection and/or diagnosis of SARS-CoV-2 by FDA under an Emergency Use Authorization (EUA). This EUA will remain  in effect (meaning this test can be used) for the duration of the COVID-19 declaration under Se ction 564(b)(1) of the Act, 21 U.S.C. section 360bbb-3(b)(1), unless the authorization is terminated or revoked sooner.  Performed at [facility], [address] [place], [place] [address]    No results found.  Review of Systems  Constitutional: Positive for activity change.  HENT: Negative.   Respiratory: Negative.   Cardiovascular: Negative.   Gastrointestinal: Negative.   Genitourinary: Negative.   Musculoskeletal: Positive for back pain and gait problem.  Neurological: Positive for numbness.  Psychiatric/Behavioral: Negative.     There were no vitals taken for this visit. Physical Exam HENT:     Head: Normocephalic and atraumatic.  Eyes:     Extraocular Movements: Extraocular movements intact.     Pupils: Pupils are equal, round, and reactive to light.  Cardiovascular:     Rate and Rhythm: Normal rate.  Pulmonary:     Effort: Pulmonary effort is normal. No respiratory distress.  Musculoskeletal:        General: Tenderness present.  Skin:    General: Skin is warm and dry.  Neurological:     Mental Status:  He is alert.  Psychiatric:        Mood and Affect: Mood normal.      Assessment/Plan Right L5-S1 HNP   We will proceed with right L5-S1 microdiscectomy as scheduled.  Surgical procedure along potential recovery time discussed.  All questions answered.  Zonia Kief, PA-C 03/31/2020, 4:00 PM

## 2020-03-31 NOTE — Anesthesia Preprocedure Evaluation (Addendum)
Anesthesia Evaluation  Patient identified by MRN, date of birth, ID band Patient awake    Reviewed: Allergy & Precautions, NPO status , Patient's Chart, lab work & pertinent test results  Airway Mallampati: II  TM Distance: >3 FB Neck ROM: Full    Dental no notable dental hx. (+) Teeth Intact, Dental Advisory Given   Pulmonary Current Smoker,    Pulmonary exam normal breath sounds clear to auscultation       Cardiovascular negative cardio ROS Normal cardiovascular exam Rhythm:Regular Rate:Normal     Neuro/Psych  Headaches, Anxiety    GI/Hepatic negative GI ROS, Neg liver ROS,   Endo/Other  diabetes, Type 2, Oral Hypoglycemic Agents  Renal/GU negative Renal ROS     Musculoskeletal   Abdominal (+) + obese,   Peds  Hematology   Anesthesia Other Findings   Reproductive/Obstetrics                            Anesthesia Physical Anesthesia Plan  ASA: II  Anesthesia Plan: General   Post-op Pain Management:    Induction: Intravenous  PONV Risk Score and Plan: 2 and Treatment may vary due to age or medical condition, Ondansetron, Dexamethasone and Midazolam  Airway Management Planned: Video Laryngoscope Planned and Oral ETT  Additional Equipment: None  Intra-op Plan:   Post-operative Plan: Extubation in OR  Informed Consent: I have reviewed the patients History and Physical, chart, labs and discussed the procedure including the risks, benefits and alternatives for the proposed anesthesia with the patient or authorized representative who has indicated his/her understanding and acceptance.     Dental advisory given  Plan Discussed with: CRNA  Anesthesia Plan Comments: (GA plus 0.4 mcg /kg dexmedetomidine)       Anesthesia Quick Evaluation

## 2020-04-01 ENCOUNTER — Encounter (HOSPITAL_COMMUNITY)
Admission: RE | Disposition: A | Discharge status: Home / Self Care | Payer: Self-pay | Source: Ambulatory Visit | Attending: Orthopaedic Surgery

## 2020-04-01 ENCOUNTER — Observation Stay (HOSPITAL_COMMUNITY)
Admission: RE | Admit: 2020-04-01 | Discharge: 2020-04-02 | Disposition: A | Discharge status: Home / Self Care | Payer: Managed Care, Other (non HMO) | Source: Ambulatory Visit | Attending: Orthopaedic Surgery | Admitting: Orthopaedic Surgery

## 2020-04-01 ENCOUNTER — Ambulatory Visit (HOSPITAL_COMMUNITY): Payer: Managed Care, Other (non HMO)

## 2020-04-01 ENCOUNTER — Encounter (HOSPITAL_COMMUNITY): Payer: Self-pay | Admitting: Orthopaedic Surgery

## 2020-04-01 ENCOUNTER — Ambulatory Visit (HOSPITAL_COMMUNITY): Payer: Managed Care, Other (non HMO) | Admitting: Anesthesiology

## 2020-04-01 ENCOUNTER — Other Ambulatory Visit: Payer: Self-pay

## 2020-04-01 DIAGNOSIS — Z419 Encounter for procedure for purposes other than remedying health state, unspecified: Secondary | ICD-10-CM

## 2020-04-01 DIAGNOSIS — Z79899 Other long term (current) drug therapy: Secondary | ICD-10-CM | POA: Insufficient documentation

## 2020-04-01 DIAGNOSIS — M5126 Other intervertebral disc displacement, lumbar region: Secondary | ICD-10-CM | POA: Diagnosis present

## 2020-04-01 DIAGNOSIS — Z7984 Long term (current) use of oral hypoglycemic drugs: Secondary | ICD-10-CM | POA: Diagnosis not present

## 2020-04-01 DIAGNOSIS — M5117 Intervertebral disc disorders with radiculopathy, lumbosacral region: Secondary | ICD-10-CM | POA: Diagnosis not present

## 2020-04-01 DIAGNOSIS — R7303 Prediabetes: Secondary | ICD-10-CM | POA: Insufficient documentation

## 2020-04-01 DIAGNOSIS — M545 Low back pain: Secondary | ICD-10-CM | POA: Diagnosis present

## 2020-04-01 HISTORY — PX: LUMBAR LAMINECTOMY: SHX95

## 2020-04-01 LAB — GLUCOSE, CAPILLARY
Glucose-Capillary: 121 mg/dL — ABNORMAL HIGH (ref 70–99)
Glucose-Capillary: 112 mg/dL — ABNORMAL HIGH (ref 70–99)
Glucose-Capillary: 179 mg/dL — ABNORMAL HIGH (ref 70–99)
Glucose-Capillary: 177 mg/dL — ABNORMAL HIGH (ref 70–99)

## 2020-04-01 SURGERY — MICRODISCECTOMY LUMBAR LAMINECTOMY
Anesthesia: General | Site: Spine Lumbar | Laterality: Right

## 2020-04-01 MED ORDER — PHENOL 1.4 % MT LIQD: 1.0000 | OROMUCOSAL | Status: DC | PRN

## 2020-04-01 MED ORDER — BUPIVACAINE HCL (PF) 0.25 % IJ SOLN
INTRAMUSCULAR | Status: AC
  Filled 2020-04-01: qty 10

## 2020-04-01 MED ORDER — PROPOFOL 10 MG/ML IV BOLUS
INTRAVENOUS | Status: DC | PRN
  Administered 2020-04-01: 200 mg via INTRAVENOUS

## 2020-04-01 MED ORDER — PROPOFOL 10 MG/ML IV BOLUS
INTRAVENOUS | Status: AC
  Filled 2020-04-01: qty 20

## 2020-04-01 MED ORDER — CEFAZOLIN SODIUM-DEXTROSE 1-4 GM/50ML-% IV SOLN
1.0000 g | Freq: Three times a day (TID) | INTRAVENOUS | Status: AC
  Administered 2020-04-01 – 2020-04-02 (×2): 1 g via INTRAVENOUS
  Filled 2020-04-01 (×2): qty 50

## 2020-04-01 MED ORDER — ACETAMINOPHEN 325 MG PO TABS: 650.0000 mg | ORAL_TABLET | ORAL | Status: DC | PRN

## 2020-04-01 MED ORDER — METHOCARBAMOL 500 MG PO TABS: 500.0000 mg | ORAL_TABLET | Freq: Four times a day (QID) | ORAL | 0 refills | Status: DC | PRN

## 2020-04-01 MED ORDER — HYDROMORPHONE HCL 1 MG/ML IJ SOLN: 0.5000 mg | INTRAMUSCULAR | Status: DC | PRN

## 2020-04-01 MED ORDER — HYDROMORPHONE HCL 1 MG/ML IJ SOLN: 0.2500 mg | INTRAMUSCULAR | Status: DC | PRN

## 2020-04-01 MED ORDER — ROCURONIUM BROMIDE 10 MG/ML (PF) SYRINGE
PREFILLED_SYRINGE | INTRAVENOUS | Status: DC | PRN
  Administered 2020-04-01: 60 mg via INTRAVENOUS

## 2020-04-01 MED ORDER — FENTANYL CITRATE (PF) 250 MCG/5ML IJ SOLN
INTRAMUSCULAR | Status: DC | PRN
  Administered 2020-04-01: 100 ug via INTRAVENOUS

## 2020-04-01 MED ORDER — METHOCARBAMOL 500 MG PO TABS
500.0000 mg | ORAL_TABLET | Freq: Four times a day (QID) | ORAL | Status: DC | PRN
  Administered 2020-04-01 – 2020-04-02 (×3): 500 mg via ORAL
  Filled 2020-04-01 (×3): qty 1

## 2020-04-01 MED ORDER — SODIUM CHLORIDE 0.9 % IV SOLN: INTRAVENOUS | Status: DC

## 2020-04-01 MED ORDER — METFORMIN HCL 500 MG PO TABS
1000.0000 mg | ORAL_TABLET | Freq: Two times a day (BID) | ORAL | Status: DC
  Administered 2020-04-01 – 2020-04-02 (×2): 1000 mg via ORAL
  Filled 2020-04-01 (×2): qty 2

## 2020-04-01 MED ORDER — ONDANSETRON HCL 4 MG/2ML IJ SOLN: 4.0000 mg | Freq: Four times a day (QID) | INTRAMUSCULAR | Status: DC | PRN

## 2020-04-01 MED ORDER — ACETAMINOPHEN 10 MG/ML IV SOLN: 1000.0000 mg | Freq: Once | INTRAVENOUS | Status: DC | PRN

## 2020-04-01 MED ORDER — BUPIVACAINE HCL (PF) 0.25 % IJ SOLN
INTRAMUSCULAR | Status: DC | PRN
  Administered 2020-04-01: 10 mL

## 2020-04-01 MED ORDER — LACTATED RINGERS IV SOLN: INTRAVENOUS | Status: DC

## 2020-04-01 MED ORDER — FENTANYL CITRATE (PF) 250 MCG/5ML IJ SOLN
INTRAMUSCULAR | Status: AC
  Filled 2020-04-01: qty 5

## 2020-04-01 MED ORDER — OXYCODONE HCL 5 MG/5ML PO SOLN: 5.0000 mg | Freq: Once | ORAL | Status: DC | PRN

## 2020-04-01 MED ORDER — SODIUM CHLORIDE 0.9% FLUSH: 3.0000 mL | INTRAVENOUS | Status: DC | PRN

## 2020-04-01 MED ORDER — ACETAMINOPHEN 650 MG RE SUPP: 650.0000 mg | RECTAL | Status: DC | PRN

## 2020-04-01 MED ORDER — ACETAMINOPHEN 500 MG PO TABS
1000.0000 mg | ORAL_TABLET | Freq: Once | ORAL | Status: AC
  Administered 2020-04-01: 1000 mg via ORAL
  Filled 2020-04-01: qty 2

## 2020-04-01 MED ORDER — DEXMEDETOMIDINE HCL 200 MCG/2ML IV SOLN
INTRAVENOUS | Status: DC | PRN
  Administered 2020-04-01: 28 ug via INTRAVENOUS

## 2020-04-01 MED ORDER — ROSUVASTATIN CALCIUM 5 MG PO TABS
10.0000 mg | ORAL_TABLET | Freq: Every day | ORAL | Status: DC
  Administered 2020-04-02: 10 mg via ORAL
  Filled 2020-04-01: qty 2

## 2020-04-01 MED ORDER — OXYCODONE HCL 5 MG PO TABS: 5.0000 mg | ORAL_TABLET | Freq: Once | ORAL | Status: DC | PRN

## 2020-04-01 MED ORDER — MENTHOL 3 MG MT LOZG: 1.0000 | LOZENGE | OROMUCOSAL | Status: DC | PRN

## 2020-04-01 MED ORDER — DOCUSATE SODIUM 100 MG PO CAPS
100.0000 mg | ORAL_CAPSULE | Freq: Two times a day (BID) | ORAL | Status: DC
  Administered 2020-04-01 – 2020-04-02 (×2): 100 mg via ORAL
  Filled 2020-04-01 (×2): qty 1

## 2020-04-01 MED ORDER — OXYCODONE HCL 5 MG PO TABS
5.0000 mg | ORAL_TABLET | ORAL | Status: DC | PRN
  Administered 2020-04-01: 10 mg via ORAL
  Administered 2020-04-01 – 2020-04-02 (×3): 5 mg via ORAL
  Filled 2020-04-01 (×3): qty 1
  Filled 2020-04-01: qty 2

## 2020-04-01 MED ORDER — MIDAZOLAM HCL 2 MG/2ML IJ SOLN
INTRAMUSCULAR | Status: AC
  Filled 2020-04-01: qty 2

## 2020-04-01 MED ORDER — METHOCARBAMOL 1000 MG/10ML IJ SOLN
500.0000 mg | Freq: Four times a day (QID) | INTRAVENOUS | Status: DC | PRN
  Filled 2020-04-01: qty 5

## 2020-04-01 MED ORDER — DEXAMETHASONE SODIUM PHOSPHATE 10 MG/ML IJ SOLN
INTRAMUSCULAR | Status: DC | PRN
  Administered 2020-04-01: 5 mg via INTRAVENOUS

## 2020-04-01 MED ORDER — DEXAMETHASONE SODIUM PHOSPHATE 10 MG/ML IJ SOLN
INTRAMUSCULAR | Status: AC
  Filled 2020-04-01: qty 1

## 2020-04-01 MED ORDER — SUCCINYLCHOLINE CHLORIDE 200 MG/10ML IV SOSY
PREFILLED_SYRINGE | INTRAVENOUS | Status: DC | PRN
  Administered 2020-04-01: 120 mg via INTRAVENOUS

## 2020-04-01 MED ORDER — ONDANSETRON HCL 4 MG/2ML IJ SOLN: 4.0000 mg | Freq: Once | INTRAMUSCULAR | Status: DC | PRN

## 2020-04-01 MED ORDER — POLYETHYLENE GLYCOL 3350 17 G PO PACK: 17.0000 g | PACK | Freq: Every day | ORAL | Status: DC | PRN

## 2020-04-01 MED ORDER — LACTATED RINGERS IV SOLN: INTRAVENOUS | Status: DC | PRN

## 2020-04-01 MED ORDER — OXYCODONE-ACETAMINOPHEN 5-325 MG PO TABS: 1.0000 | ORAL_TABLET | ORAL | 0 refills | Status: DC | PRN

## 2020-04-01 MED ORDER — CHLORHEXIDINE GLUCONATE 0.12 % MT SOLN
OROMUCOSAL | Status: AC
  Filled 2020-04-01: qty 15

## 2020-04-01 MED ORDER — 0.9 % SODIUM CHLORIDE (POUR BTL) OPTIME
TOPICAL | Status: DC | PRN
  Administered 2020-04-01: 1000 mL

## 2020-04-01 MED ORDER — SODIUM CHLORIDE 0.9% FLUSH: 3.0000 mL | Freq: Two times a day (BID) | INTRAVENOUS | Status: DC

## 2020-04-01 MED ORDER — SODIUM CHLORIDE 0.9 % IV SOLN: 250.0000 mL | INTRAVENOUS | Status: DC

## 2020-04-01 MED ORDER — THROMBIN (RECOMBINANT) 5000 UNITS EX SOLR
CUTANEOUS | Status: AC
  Filled 2020-04-01: qty 5000

## 2020-04-01 MED ORDER — ONDANSETRON HCL 4 MG/2ML IJ SOLN
INTRAMUSCULAR | Status: AC
  Filled 2020-04-01: qty 2

## 2020-04-01 MED ORDER — LIDOCAINE 2% (20 MG/ML) 5 ML SYRINGE
INTRAMUSCULAR | Status: AC
  Filled 2020-04-01: qty 5

## 2020-04-01 MED ORDER — ROCURONIUM BROMIDE 10 MG/ML (PF) SYRINGE
PREFILLED_SYRINGE | INTRAVENOUS | Status: AC
  Filled 2020-04-01: qty 10

## 2020-04-01 MED ORDER — DROPERIDOL 2.5 MG/ML IJ SOLN: 0.6250 mg | Freq: Once | INTRAMUSCULAR | Status: DC | PRN

## 2020-04-01 MED ORDER — GLIPIZIDE 5 MG PO TABS
5.0000 mg | ORAL_TABLET | Freq: Two times a day (BID) | ORAL | Status: DC
  Administered 2020-04-01 – 2020-04-02 (×2): 5 mg via ORAL
  Filled 2020-04-01 (×2): qty 1

## 2020-04-01 MED ORDER — MIDAZOLAM HCL 2 MG/2ML IJ SOLN
INTRAMUSCULAR | Status: DC | PRN
  Administered 2020-04-01: 2 mg via INTRAVENOUS

## 2020-04-01 MED ORDER — ONDANSETRON HCL 4 MG/2ML IJ SOLN
INTRAMUSCULAR | Status: DC | PRN
  Administered 2020-04-01: 4 mg via INTRAVENOUS

## 2020-04-01 MED ORDER — ONDANSETRON HCL 4 MG PO TABS: 4.0000 mg | ORAL_TABLET | Freq: Four times a day (QID) | ORAL | Status: DC | PRN

## 2020-04-01 MED ORDER — LIDOCAINE 2% (20 MG/ML) 5 ML SYRINGE
INTRAMUSCULAR | Status: DC | PRN
  Administered 2020-04-01: 100 mg via INTRAVENOUS

## 2020-04-01 MED ORDER — SUGAMMADEX SODIUM 200 MG/2ML IV SOLN
INTRAVENOUS | Status: DC | PRN
  Administered 2020-04-01: 200 mg via INTRAVENOUS

## 2020-04-01 SURGICAL SUPPLY — 43 items
BENZOIN TINCTURE PRP APPL 2/3 (GAUZE/BANDAGES/DRESSINGS) ×2 IMPLANT
BUR ROUND FLUTED 4 SOFT TCH (BURR) ×2 IMPLANT
CANISTER SUCT 3000ML PPV (MISCELLANEOUS) ×2 IMPLANT
COVER SURGICAL LIGHT HANDLE (MISCELLANEOUS) ×2 IMPLANT
COVER WAND RF STERILE (DRAPES) IMPLANT
DERMABOND ADVANCED (GAUZE/BANDAGES/DRESSINGS) ×1
DERMABOND ADVANCED .7 DNX12 (GAUZE/BANDAGES/DRESSINGS) ×1 IMPLANT
DRAPE MICROSCOPE LEICA (MISCELLANEOUS) ×2 IMPLANT
DRSG MEPILEX BORDER 4X4 (GAUZE/BANDAGES/DRESSINGS) IMPLANT
DRSG MEPILEX BORDER 4X8 (GAUZE/BANDAGES/DRESSINGS) ×2 IMPLANT
DURAPREP 26ML APPLICATOR (WOUND CARE) ×2 IMPLANT
DURASEAL SPINE SEALANT 3ML (MISCELLANEOUS) IMPLANT
ELECT REM PT RETURN 9FT ADLT (ELECTROSURGICAL) ×2
ELECTRODE REM PT RTRN 9FT ADLT (ELECTROSURGICAL) ×1 IMPLANT
GAUZE SPONGE 4X4 12PLY STRL (GAUZE/BANDAGES/DRESSINGS) ×2 IMPLANT
GLOVE BIOGEL PI IND STRL 8 (GLOVE) ×2 IMPLANT
GLOVE BIOGEL PI INDICATOR 8 (GLOVE) ×2
GLOVE ORTHO TXT STRL SZ7.5 (GLOVE) ×4 IMPLANT
GOWN STRL REUS W/ TWL LRG LVL3 (GOWN DISPOSABLE) ×1 IMPLANT
GOWN STRL REUS W/ TWL XL LVL3 (GOWN DISPOSABLE) ×1 IMPLANT
GOWN STRL REUS W/TWL 2XL LVL3 (GOWN DISPOSABLE) ×2 IMPLANT
GOWN STRL REUS W/TWL LRG LVL3 (GOWN DISPOSABLE) ×1
GOWN STRL REUS W/TWL XL LVL3 (GOWN DISPOSABLE) ×1
KIT BASIN OR (CUSTOM PROCEDURE TRAY) ×2 IMPLANT
KIT TURNOVER KIT B (KITS) ×2 IMPLANT
NEEDLE SPNL 18GX3.5 QUINCKE PK (NEEDLE) ×2 IMPLANT
NS IRRIG 1000ML POUR BTL (IV SOLUTION) ×2 IMPLANT
PACK LAMINECTOMY ORTHO (CUSTOM PROCEDURE TRAY) ×2 IMPLANT
PAD ARMBOARD 7.5X6 YLW CONV (MISCELLANEOUS) ×4 IMPLANT
PATTIES SURGICAL .5 X.5 (GAUZE/BANDAGES/DRESSINGS) IMPLANT
PATTIES SURGICAL .75X.75 (GAUZE/BANDAGES/DRESSINGS) IMPLANT
STRIP CLOSURE SKIN 1/2X4 (GAUZE/BANDAGES/DRESSINGS) ×2 IMPLANT
SUT VIC AB 0 CT1 27 (SUTURE) ×1
SUT VIC AB 0 CT1 27XBRD ANBCTR (SUTURE) ×1 IMPLANT
SUT VIC AB 1 CTX 36 (SUTURE) ×1
SUT VIC AB 1 CTX36XBRD ANBCTR (SUTURE) ×1 IMPLANT
SUT VIC AB 2-0 CT1 27 (SUTURE) ×1
SUT VIC AB 2-0 CT1 TAPERPNT 27 (SUTURE) ×1 IMPLANT
SUT VIC AB 3-0 X1 27 (SUTURE) ×2 IMPLANT
SYR 20ML ECCENTRIC (SYRINGE) IMPLANT
TOWEL GREEN STERILE (TOWEL DISPOSABLE) ×2 IMPLANT
TOWEL GREEN STERILE FF (TOWEL DISPOSABLE) ×2 IMPLANT
WATER STERILE IRR 1000ML POUR (IV SOLUTION) IMPLANT

## 2020-04-01 NOTE — Interval H&P Note (Signed)
History and Physical Interval Note:  04/01/2020 9:20 AM  Tyler Atkins  has presented today for surgery, with the diagnosis of right L5-S1 herniated nucleus pulposus.  The various methods of treatment have been discussed with the patient and family. After consideration of risks, benefits and other options for treatment, the patient has consented to  Procedure(s): RIGHT L5-S1 MICRODISCECTOMY (N/A) as a surgical intervention.  The patient's history has been reviewed, patient examined, no change in status, stable for surgery.  I have reviewed the patient's chart and labs.  Questions were answered to the patient's satisfaction.     Eldred Manges

## 2020-04-01 NOTE — Anesthesia Procedure Notes (Signed)
Procedure Name: Intubation Date/Time: 04/01/2020 10:13 AM Performed by: Shary Decamp, CRNA Pre-anesthesia Checklist: Patient identified, Emergency Drugs available, Suction available and Patient being monitored Patient Re-evaluated:Patient Re-evaluated prior to induction Oxygen Delivery Method: Circle system utilized Preoxygenation: Pre-oxygenation with 100% oxygen Induction Type: IV induction Ventilation: Mask ventilation without difficulty Grade View: Grade I Tube type: Oral Tube size: 8.0 mm Number of attempts: 1 Airway Equipment and Method: Stylet and Oral airway Placement Confirmation: ETT inserted through vocal cords under direct vision,  positive ETCO2 and breath sounds checked- equal and bilateral Tube secured with: Tape Dental Injury: Teeth and Oropharynx as per pre-operative assessment

## 2020-04-01 NOTE — Transfer of Care (Signed)
Immediate Anesthesia Transfer of Care Note  Patient: Tyler Atkins  Procedure(s) Performed: RIGHT Lumbar 5-sacral 1 MICRODISCECTOMY (Right Spine Lumbar)  Patient Location: PACU  Anesthesia Type:General  Level of Consciousness: drowsy, patient cooperative and responds to stimulation  Airway & Oxygen Therapy: Patient Spontanous Breathing and Patient connected to face mask oxygen  Post-op Assessment: Report given to RN, Post -op Vital signs reviewed and stable and Patient moving all extremities X 4  Post vital signs: Reviewed and stable  Last Vitals:  Vitals Value Taken Time  BP    Temp    Pulse 75 04/01/20 1155  Resp 10 04/01/20 1155  SpO2 100 % 04/01/20 1155  Vitals shown include unvalidated device data.  Last Pain:  Vitals:   04/01/20 0707  TempSrc:   PainSc: 2       Patients Stated Pain Goal: 2 (04/01/20 0707)  Complications: No complications documented.

## 2020-04-01 NOTE — Anesthesia Postprocedure Evaluation (Signed)
Anesthesia Post Note  Patient: Tyler Atkins  Procedure(s) Performed: RIGHT Lumbar 5-sacral 1 MICRODISCECTOMY (Right Spine Lumbar)     Patient location during evaluation: PACU Anesthesia Type: General Level of consciousness: awake and alert Pain management: pain level controlled Vital Signs Assessment: post-procedure vital signs reviewed and stable Respiratory status: spontaneous breathing, nonlabored ventilation, respiratory function stable and patient connected to nasal cannula oxygen Cardiovascular status: blood pressure returned to baseline and stable Postop Assessment: no apparent nausea or vomiting Anesthetic complications: no   No complications documented.  Last Vitals:  Vitals:   04/01/20 1210 04/01/20 1251  BP: 121/80 117/75  Pulse: 73 64  Resp: 19 20  Temp: (!) 36.3 C 36.9 C  SpO2: 93% 95%    Last Pain:  Vitals:   04/01/20 1251  TempSrc: Oral  PainSc:                  [name]

## 2020-04-01 NOTE — Op Note (Signed)
Preop diagnosis: Right L5-S1 disc extrusion with radiculopathy.  Postop diagnosis: Same  Procedure: Right L5-S1 microdiscectomy.  Surgeon Annell Greening, MD  Assistant: Zonia Kief, PA-C medically necessary and present for the entire procedure  Anesthesia General plus Marcaine skin local  EBL minimal.  Findings disc protrusion with extrusion cephalad over S1 body right side with S1 nerve displacement.  Procedure: After induction general anesthesia 3 g Ancef was given prophylactically due to the patient's body weight.  Patient was intubated placed on chest rolls prone careful padding positioning calf pumpers.  1010 drape was applied and DuraPrep was used.  Area squared with towels Betadine Steri-Drape laminectomy sheet.  Needle localization after timeout procedure with spinal needle at the L5-S1 interspace.  Due to the patient's thick adipose layer was difficult to palpate spinous processes.  After midline incision was made fine with spinous process was located subperiosteal dissection and extra deep Taylor retractor was placed and a second x-ray was taken with a Penfield 4 in the interlaminar space on the right side between L5 and S1.  This confirmed appropriate level.  Probe was used to remove the inferior aspect of the lamina and portion of the medial wall of the facet.  Chunks of ligament were removed.  There is abundant epidural fat some this was removed.  Epidural veins were coagulated with bipolar cautery.  Nerve root was retracted and bone was removed at the level of the pedicle.  There was a prominent disc protrusion with some extruded disc caudally.  Annulus was incised chunks of disc were removed using straight pituitary up-biting pituitary micropituitary's Epstein curettes.  Continue removal until epidural space was decompressed hockey-stick could be passed anterior to the dura with no areas of compression nerve root was completely free and no longer displaced.  Veins in the gutter were  coagulated the bipolar epidural space was dry irrigated rechecked dry final passes were made making sure all remaining disc pieces had been removed and then standard layered closure with #1 Vicryl in the deep fascia ~subtenons tissue subjective closure Dermabond postop dressing and transferred to care room.

## 2020-04-01 NOTE — Discharge Instructions (Addendum)
Ok to shower 1 days postop.  Do not apply any creams or ointments to incision.  Your dressing is waterproof and you can shower then blot dry with a towel.  No aggressive activity.  No bending, squatting or prolonged sitting.  Mostly be in reclined position or lying down.    Activity Walk each and every day, increasing distance each day. No lifting greater than 5 lbs.    Diet Resume your normal diet.  Return to Work Will be discussed at your follow up appointment. Call Your Doctor If Any of These Occur Redness, drainage, or swelling at the wound.  Temperature greater than 101 degrees. Severe pain not relieved by pain medication. Incision starts to come apart.

## 2020-04-02 ENCOUNTER — Encounter (HOSPITAL_COMMUNITY): Payer: Self-pay | Admitting: Orthopaedic Surgery

## 2020-04-02 DIAGNOSIS — M5117 Intervertebral disc disorders with radiculopathy, lumbosacral region: Secondary | ICD-10-CM | POA: Diagnosis not present

## 2020-04-02 LAB — GLUCOSE, CAPILLARY: Glucose-Capillary: 106 mg/dL — ABNORMAL HIGH (ref 70–99)

## 2020-04-02 NOTE — Evaluation (Signed)
Physical Therapy Evaluation Patient Details Name: Tyler Atkins MRN: [id number] DOB: 06-23-1976 Today's Date: 04/02/2020   History of Present Illness  Patient is a 44 yo male s/p spinal surgery  Clinical Impression  Patient seen for mobility assessment s/p spinal surgery. Mobilizing well. Educated patient on precautions, mobility expectations, safety and car transfers. No further acute PT needs. Will sign off.     Follow Up Recommendations No PT follow up    Equipment Recommendations  None recommended by PT    Recommendations for Other Services       Precautions / Restrictions Precautions Precautions: Back Precaution Booklet Issued: Yes (comment) Precaution Comments: verbally reviewed      Mobility  Bed Mobility               General bed mobility comments: educated on technique  Transfers Overall transfer level: Modified independent               General transfer comment: increased time  Ambulation/Gait Ambulation/Gait assistance: Modified independent (Device/Increase time) Gait Distance (Feet): 310 Feet Assistive device: None Gait Pattern/deviations: Decreased stride length Gait velocity: decreased Gait velocity interpretation: <1.8 ft/sec, indicate of risk for recurrent falls General Gait Details: slow guarded gait, no physical assist or overt LOB   Stairs            Wheelchair Mobility    Modified Rankin (Stroke Patients Only)       Balance Overall balance assessment: Mild deficits observed, not formally tested                                           Pertinent Vitals/Pain Pain Assessment: Faces Faces Pain Scale: Hurts little more Pain Location: low back and bilateral hips Posterior Pain Descriptors / Indicators: Sore Pain Intervention(s): Monitored during session    Home Living Family/patient expects to be discharged to:: Private residence Living Arrangements: Spouse/significant other;Children Available Help at  Discharge: Family Type of Home: House Home Access: Level entry     Home Layout: Able to live on main level with bedroom/bathroom Home Equipment: None      Prior Function Level of Independence: Independent               Hand Dominance   Dominant Hand: Right    Extremity/Trunk Assessment   Upper Extremity Assessment Upper Extremity Assessment: Defer to OT evaluation    Lower Extremity Assessment Lower Extremity Assessment: Overall WFL for tasks assessed       Communication   Communication: No difficulties  Cognition Arousal/Alertness: Awake/alert Behavior During Therapy: WFL for tasks assessed/performed Overall Cognitive Status: Within Functional Limits for tasks assessed                                        General Comments      Exercises     Assessment/Plan    PT Assessment Patent does not need any further PT services  PT Problem List         PT Treatment Interventions      PT Goals (Current goals can be found in the Care Plan section)  Acute Rehab PT Goals PT Goal Formulation: All assessment and education complete, DC therapy    Frequency     Barriers to discharge        Co-evaluation  AM-PAC PT "6 Clicks" Mobility  Outcome Measure Help needed turning from your back to your side while in a flat bed without using bedrails?: None Help needed moving from lying on your back to sitting on the side of a flat bed without using bedrails?: None Help needed moving to and from a bed to a chair (including a wheelchair)?: None Help needed standing up from a chair using your arms (e.g., wheelchair or bedside chair)?: A Little Help needed to walk in hospital room?: A Little Help needed climbing 3-5 steps with a railing? : A Little 6 Click Score: 21    End of Session   Activity Tolerance: Patient tolerated treatment well Patient left: in chair Nurse Communication: Mobility status PT Visit Diagnosis: Difficulty in  walking, not elsewhere classified (R26.2)    Time: 2924-4628 PT Time Calculation (min) (ACUTE ONLY): 19 min   Charges:   PT Evaluation $PT Eval Low Complexity: 1 Low          Charlotte Crumb, PT DPT  Board Certified Neurologic Specialist Acute Rehabilitation Services Office [phone]   Fabio Asa 04/02/2020, 7:54 AM

## 2020-04-02 NOTE — Evaluation (Signed)
Occupational Therapy Evaluation Patient Details Name: Tyler Atkins MRN: [id number] DOB: 1976-05-02 Today's Date: 04/02/2020    History of Present Illness Pt is a 44 y.o. with PMH of type 2 DM, depression, and PTSD presenting for proceedure with low back pain radiating through R LE adn R LE weakness. S/p L5-S1 microdiscectomy on 04/01/2020.   Clinical Impression   PTA pt reports being independent with all ADLs and IADLs. Pt was admitted for above and treated for problem list below (see OT Problem List). Pt able to recall 3/3 back precautions demonstrating good recall and safety awareness. Requires supervision-Min Guard with ADLs due to pain and maintaining back precautions. Educated pt on compensatory techniques for grooming at sink, perineal care, and dressing while maintaining back precautions - pt demonstrated good recall demonstration. Requires Min Guard with transfers and supervision with ambulation due to pain with sit<>stand. Believe pt has good support, demonstrates good safety awareness, and progressing towards baseline. OT to sign off - thank you for referral.   *Supervising OTR/L present for evaluation and assisted in documentation    Follow Up Recommendations  No OT follow up    Equipment Recommendations  3 in 1 bedside commode       Precautions / Restrictions Precautions Precautions: Back Precaution Booklet Issued: Yes (comment) Precaution Comments: verbally reviewed      Mobility Bed Mobility               General bed mobility comments: Pt sitting EOB upon entry. Educated on log roll technique and reviewed handout  Transfers Overall transfer level: Needs assistance Equipment used: None Transfers: Sit to/from Stand Sit to Stand: Min guard         General transfer comment: Min guard for safety    Balance Overall balance assessment: Mild deficits observed, not formally tested                                         ADL either performed or  assessed with clinical judgement   ADL Overall ADL's : Needs assistance/impaired     Grooming: Supervision/safety;Cueing for compensatory techniques;Standing Grooming Details (indicate cue type and reason): education given at sink for grooming with compensatory techniques for back precautions Upper Body Bathing: Supervision/ safety;Standing Upper Body Bathing Details (indicate cue type and reason): supervision for maintaining back precautions Lower Body Bathing: Sit to/from stand;Cueing for compensatory techniques;Supervison/ safety Lower Body Bathing Details (indicate cue type and reason): supervision for maintaining back precautions Upper Body Dressing : Supervision/safety;Cueing for compensatory techniques;Sitting Upper Body Dressing Details (indicate cue type and reason): supervision for maintaining back precautions Lower Body Dressing: Min guard;Sit to/from stand;Adhering to back precautions Lower Body Dressing Details (indicate cue type and reason): Min guard for assist with sit<>stand Toilet Transfer: Min guard;Ambulation;Regular Toilet;BSC Toilet Transfer Details (indicate cue type and reason): min guard for transfer sit<>stand Toileting- Clothing Manipulation and Hygiene: Adhering to back precautions;Sitting/lateral lean;Sit to/from stand;Min guard Toileting - Clothing Manipulation Details (indicate cue type and reason): Min guard for sit to stand Tub/ Shower Transfer: Supervision/safety;Ambulation;Adhering to back precautions Tub/Shower Transfer Details (indicate cue type and reason): supervision for safety with back precautions Functional mobility during ADLs: Supervision/safety General ADL Comments: supervision-Min guard with ADLs for maintaining back precautions and pain with sit<>stand     Vision Baseline Vision/History: No visual deficits Patient Visual Report: No change from baseline  Pertinent Vitals/Pain Pain Assessment: Faces Faces Pain Scale: Hurts a  little bit Pain Location: lower back and hips Pain Descriptors / Indicators: Sore Pain Intervention(s): Monitored during session     Hand Dominance Right   Extremity/Trunk Assessment Upper Extremity Assessment Upper Extremity Assessment: Overall WFL for tasks assessed   Lower Extremity Assessment Lower Extremity Assessment: Defer to PT evaluation   Cervical / Trunk Assessment Cervical / Trunk Assessment: Other exceptions Cervical / Trunk Exceptions: s/p back surgery   Communication Communication Communication: No difficulties   Cognition Arousal/Alertness: Awake/alert Behavior During Therapy: WFL for tasks assessed/performed Overall Cognitive Status: Within Functional Limits for tasks assessed                                 General Comments: pt able to recall 3/3 precautions   General Comments  Pt's wife and daughter present for eval and confirmed support at home            Home Living Family/patient expects to be discharged to:: Private residence Living Arrangements: Spouse/significant other;Children Available Help at Discharge: Family;Available PRN/intermittently;Available 24 hours/day Type of Home: House Home Access: Level entry     Home Layout: Able to live on main level with bedroom/bathroom     Bathroom Shower/Tub: Chief Strategy Officer: Standard     Home Equipment: None          Prior Functioning/Environment Level of Independence: Independent                 OT Problem List: Impaired balance (sitting and/or standing);Decreased knowledge of use of DME or AE;Obesity;Pain         OT Goals(Current goals can be found in the care plan section) Acute Rehab OT Goals Patient Stated Goal: go home OT Goal Formulation: With patient/family Time For Goal Achievement: 04/16/20 Potential to Achieve Goals: Good                AM-PAC OT "6 Clicks" Daily Activity     Outcome Measure Help from another person eating  meals?: None Help from another person taking care of personal grooming?: A Little Help from another person toileting, which includes using toliet, bedpan, or urinal?: A Little Help from another person bathing (including washing, rinsing, drying)?: A Little Help from another person to put on and taking off regular upper body clothing?: A Little Help from another person to put on and taking off regular lower body clothing?: A Little 6 Click Score: 19   End of Session Equipment Utilized During Treatment: Gait belt Nurse Communication: Mobility status  Activity Tolerance: Patient tolerated treatment well Patient left: in bed;with call bell/phone within reach;with family/visitor present  OT Visit Diagnosis: Other symptoms and signs involving the nervous system (R29.898);Pain Pain - Right/Left:  (right and left) Pain - part of body:  (low back and hips)                Time: 7169-6789 OT Time Calculation (min): 9 min Charges:  OT General Charges $OT Visit: 1 Visit OT Evaluation $OT Eval Low Complexity: 1 Low  Milbert Bixler/OTS   Reyann Troop 04/02/2020, 8:47 AM

## 2020-04-02 NOTE — Progress Notes (Signed)
Subjective: 1 Day Post-Op Procedure(s) (LRB): RIGHT Lumbar 5-sacral 1 MICRODISCECTOMY (Right) Patient reports pain as mild.  Able to void and tolerating diet well. Sitting on edge of bed gets up on own and able to ambulate.   Objective: Vital signs in last 24 hours: Temp:  [97.3 F (36.3 C)-99.5 F (37.5 C)] 98.4 F (36.9 C) (07/10 0727) Pulse Rate:  [62-85] 62 (07/10 0727) Resp:  [10-20] 18 (07/10 0727) BP: (117-152)/(56-93) 136/83 (07/10 0727) SpO2:  [92 %-100 %] 95 % (07/10 0727)  Intake/Output from previous day: 07/09 0701 - 07/10 0700 In: 1400 [I.V.:1400] Out: 35 [Blood:35] Intake/Output this shift: No intake/output data recorded.  Recent Labs    03/30/20 0849  HGB 16.6   Recent Labs    03/30/20 0849  WBC 9.9  RBC 5.38  HCT 47.4  PLT 201   Recent Labs    03/30/20 0849  NA 138  K 4.2  CL 105  CO2 23  BUN 12  CREATININE 0.86  GLUCOSE 124*  CALCIUM 9.2   No results for input(s): LABPT, INR in the last 72 hours.  LowerExtremities: Dorsiflexion intact bilateral ankles Calves supple non tender  Back: Incision dressing clean dry and intact.    Assessment/Plan: 1 Day Post-Op Procedure(s) (LRB): RIGHT Lumbar 5-sacral 1 MICRODISCECTOMY (Right) Discharge home with home health      Tyler Atkins 04/02/2020, 7:55 AM

## 2020-04-02 NOTE — Progress Notes (Signed)
Patient alert and oriented, mae's well, voiding adequate amount of urine, swallowing without difficulty, no c/o pain at time of discharge. Patient discharged home with family. Script and discharged instructions given to patient. Patient and family stated understanding of instructions given. Patient has an appointment with Dr. Yates  

## 2020-04-05 NOTE — Addendum Note (Signed)
Addendum  created 04/05/20 1037 by [name], MD   Intraprocedure Staff edited

## 2020-04-05 NOTE — Discharge Summary (Signed)
Patient ID: Tyler Atkins MRN: [id number] DOB/AGE: 03/07/1976 44 y.o.  Admit date: 04/01/2020 Discharge date: 04/02/2020  Admission Diagnoses:  Active Problems:   Protrusion of lumbar intervertebral disc   HNP (herniated nucleus pulposus), lumbar   Discharge Diagnoses:  Active Problems:   Protrusion of lumbar intervertebral disc   HNP (herniated nucleus pulposus), lumbar  status post Procedure(s): RIGHT Lumbar 5-sacral 1 MICRODISCECTOMY  Past Medical History:  Diagnosis Date  . Depression   . Headache    MIGRAINES  . Pre-diabetes   . PTSD (post-traumatic stress disorder)   . UTI (lower urinary tract infection)     Surgeries: Procedure(s): RIGHT Lumbar 5-sacral 1 MICRODISCECTOMY on 04/01/2020   Consultants:   Discharged Condition: Improved  Hospital Course: Tyler Atkins is an 45 y.o. male who was admitted 04/01/2020 for operative treatment of L5-S1 HNP. Patient failed conservative treatments (please see the history and physical for the specifics) and had severe unremitting pain that affects sleep, daily activities and work/hobbies. After pre-op clearance, the patient was taken to the operating room on 04/01/2020 and underwent  Procedure(s): RIGHT Lumbar 5-sacral 1 MICRODISCECTOMY.    Patient was given perioperative antibiotics:  Anti-infectives (From admission, onward)   Start     Dose/Rate Route Frequency Ordered Stop   04/01/20 1700  ceFAZolin (ANCEF) IVPB 1 g/50 mL premix        1 g 100 mL/hr over 30 Minutes Intravenous Every 8 hours 04/01/20 1244 04/02/20 0108   04/01/20 0600  ceFAZolin (ANCEF) 3 g in dextrose 5 % 50 mL IVPB        3 g 100 mL/hr over 30 Minutes Intravenous On call to O.R. 03/31/20 0813 04/01/20 1010       Patient was given sequential compression devices and early ambulation to prevent DVT.   Patient benefited maximally from hospital stay and there were no complications. At the time of discharge, the patient was urinating/moving their bowels without  difficulty, tolerating a regular diet, pain is controlled with oral pain medications and they have been cleared by PT/OT.   Recent vital signs: No data found.   Recent laboratory studies: No results for input(s): WBC, HGB, HCT, PLT, NA, K, CL, CO2, BUN, CREATININE, GLUCOSE, INR, CALCIUM in the last 72 hours.  Invalid input(s): PT, 2   Discharge Medications:   Allergies as of 04/02/2020   No Known Allergies     Medication List    STOP taking these medications   cyclobenzaprine 10 MG tablet Commonly known as: FLEXERIL     TAKE these medications   glipiZIDE 5 MG tablet Commonly known as: GLUCOTROL Take 1 tablet (5 mg total) by mouth 2 (two) times daily with a meal.   metFORMIN 1000 MG tablet Commonly known as: GLUCOPHAGE Take 1 tablet (1,000 mg total) by mouth 2 (two) times daily with a meal.   methocarbamol 500 MG tablet Commonly known as: Robaxin Take 1 tablet (500 mg total) by mouth every 6 (six) hours as needed for muscle spasms.   oxyCODONE-acetaminophen 5-325 MG tablet Commonly known as: PERCOCET/ROXICET Take 1-2 tablets by mouth every 4 (four) hours as needed for severe pain.   rosuvastatin 10 MG tablet Commonly known as: Crestor Take 1 tablet (10 mg total) by mouth daily.       Diagnostic Studies: DG Lumbar Spine 2-3 Views  Result Date: 04/01/2020 CLINICAL DATA:  L5-S1 micro discectomy. EXAM: LUMBAR SPINE - 2-3 VIEW COMPARISON:  December 08, 2019.  July 22, 2014. FINDINGS: Two intraoperative lateral  projections were obtained of the lumbar spine. The first image demonstrates surgical needle directed toward posterior elements of L5. The second image demonstrates surgical probe directed toward posterior portion of L5-S1. IMPRESSION: Surgical localization as described above. Electronically Signed   By: Lupita Raider M.D.   On: 04/01/2020 14:29    Discharge Instructions    Incentive spirometry RT   Complete by: As directed        Follow-up Information     Eldred Manges, MD In 1 week.   Specialty: Orthopedic Surgery Contact information: [address] [place] [address] [phone]               Discharge Plan:  discharge to home  Disposition:     Signed: Zonia Kief for Annell Greening MD 04/05/2020, 3:24 PM

## 2020-04-06 ENCOUNTER — Telehealth: Payer: Self-pay | Admitting: Orthopaedic Surgery

## 2020-04-06 NOTE — Telephone Encounter (Signed)
[employer] disability forms received. Sent to [employer].

## 2020-04-07 ENCOUNTER — Encounter: Payer: Self-pay | Admitting: Surgery

## 2020-04-07 ENCOUNTER — Ambulatory Visit (INDEPENDENT_AMBULATORY_CARE_PROVIDER_SITE_OTHER): Payer: Managed Care, Other (non HMO) | Admitting: Surgery

## 2020-04-07 VITALS — BP 119/85 | HR 71 | Ht 75.0 in | Wt 300.0 lb

## 2020-04-07 DIAGNOSIS — Z9889 Other specified postprocedural states: Secondary | ICD-10-CM

## 2020-04-07 MED ORDER — OXYCODONE-ACETAMINOPHEN 5-325 MG PO TABS: 1.0000 | ORAL_TABLET | Freq: Four times a day (QID) | ORAL | 0 refills | Status: DC | PRN

## 2020-04-07 NOTE — Progress Notes (Signed)
   Post-Op Visit Note   Patient: Tyler Atkins           Date of Birth: 1976/09/19           MRN: [id number] Visit Date: 04/07/2020 PCP: Georgina Quint, MD   Assessment & Plan:  Chief Complaint:  Chief Complaint  Patient presents with  . Lower Back - Routine Post Op  Patient returns.  He is 2-week status post L5-S1 microdiscectomy.  States that his preop leg pain is better.  No issues.  Needs refill of pain medication.    Visit Diagnoses:  1. Status post lumbar microdiscectomy     Plan: Refilled Percocet prescription.  Follow with Dr. Ophelia Charter in 5 weeks for recheck.  Given a note keeping him out of work at least 6 to 8 weeks postop.  He will avoid bending, twisting, lifting, driving.  Follow-Up Instructions: Return in about 5 weeks (around 05/12/2020) for with dr yates.   Orders:  No orders of the defined types were placed in this encounter.  Meds ordered this encounter  Medications  . oxyCODONE-acetaminophen (PERCOCET/ROXICET) 5-325 MG tablet    Sig: Take 1 tablet by mouth every 6 (six) hours as needed for severe pain.    Dispense:  50 tablet    Refill:  0    Imaging: No results found.  PMFS History: Patient Active Problem List   Diagnosis Date Noted  . Protrusion of lumbar intervertebral disc 04/01/2020  . HNP (herniated nucleus pulposus), lumbar 04/01/2020  . New onset type 2 diabetes mellitus (HCC) 12/16/2019   Past Medical History:  Diagnosis Date  . Depression   . Headache    MIGRAINES  . Pre-diabetes   . PTSD (post-traumatic stress disorder)   . UTI (lower urinary tract infection)     Family History  Problem Relation Age of Onset  . Cancer Mother   . Heart disease Father     Past Surgical History:  Procedure Laterality Date  . CERVICAL DISC ARTHROPLASTY    . LUMBAR LAMINECTOMY Right 04/01/2020   Procedure: RIGHT Lumbar 5-sacral 1 MICRODISCECTOMY;  Surgeon: Eldred Manges, MD;  Location: [facility];  Service: Orthopedics;  Laterality: Right;  .  TONSILLECTOMY     Social History   Occupational History  . Not on file  Tobacco Use  . Smoking status: Current Every Day Smoker    Packs/day: 1.00    Types: Cigarettes  . Smokeless tobacco: Never Used  Vaping Use  . Vaping Use: Never used  Substance and Sexual Activity  . Alcohol use: Yes    Comment: 1-2x/month  . Drug use: No  . Sexual activity: Not on file   Exam Lumbar wound looks good.  New Steri-Strips applied.  No drainage or signs of infection.  He is neurologically intact.

## 2020-04-27 ENCOUNTER — Telehealth: Payer: Self-pay | Admitting: Orthopaedic Surgery

## 2020-04-27 NOTE — Telephone Encounter (Signed)
Faxed ov notes 7/1-present to [employer] disability including form completed/signed 7/26 [phone]

## 2020-05-10 ENCOUNTER — Ambulatory Visit: Payer: Managed Care, Other (non HMO) | Admitting: Orthopaedic Surgery

## 2020-05-18 ENCOUNTER — Telehealth: Payer: Self-pay | Admitting: Orthopaedic Surgery

## 2020-05-18 NOTE — Telephone Encounter (Signed)
[employer] form received. Sent to [employer]. 

## 2020-05-24 ENCOUNTER — Telehealth: Payer: Self-pay

## 2020-05-24 NOTE — Telephone Encounter (Signed)
Disability form received from [employer] and sent to [employer] °

## 2020-05-31 ENCOUNTER — Ambulatory Visit (INDEPENDENT_AMBULATORY_CARE_PROVIDER_SITE_OTHER): Payer: Managed Care, Other (non HMO) | Admitting: Orthopaedic Surgery

## 2020-05-31 ENCOUNTER — Other Ambulatory Visit: Payer: Self-pay

## 2020-05-31 ENCOUNTER — Encounter: Payer: Self-pay | Admitting: Orthopaedic Surgery

## 2020-05-31 VITALS — Ht 75.0 in | Wt 300.0 lb

## 2020-05-31 DIAGNOSIS — Z9889 Other specified postprocedural states: Secondary | ICD-10-CM | POA: Insufficient documentation

## 2020-05-31 DIAGNOSIS — M5126 Other intervertebral disc displacement, lumbar region: Secondary | ICD-10-CM

## 2020-05-31 NOTE — Progress Notes (Signed)
   Post-Op Visit Note   Patient: Tyler Atkins           Date of Birth: 1976/03/20           MRN: [id number] Visit Date: 05/31/2020 PCP: Georgina Quint, MD   Assessment & Plan: Post microdiscectomy right L5-S1 04/01/2020.  Incision is well-healed.  He is able to walk on his heels and toes.  Work slip given for work resumption on 06/06/2020.  Office follow-up as needed.  Chief Complaint:  Chief Complaint  Patient presents with  . Lower Back - Routine Post Op   Visit Diagnoses:  1. HNP (herniated nucleus pulposus), lumbar   2. Status post lumbar microdiscectomy     Plan: Work note given for work resumption next Monday.  Office follow-up as needed.  He is happy with the results of surgery.  Patient states he is looking to join a gym in a few weeks and work on some weight loss and core strengthening.  Follow-Up Instructions: No follow-ups on file.   Orders:  No orders of the defined types were placed in this encounter.  No orders of the defined types were placed in this encounter.   Imaging: No results found.  PMFS History: Patient Active Problem List   Diagnosis Date Noted  . Status post lumbar microdiscectomy 05/31/2020  . New onset type 2 diabetes mellitus (HCC) 12/16/2019   Past Medical History:  Diagnosis Date  . Depression   . Headache    MIGRAINES  . Pre-diabetes   . PTSD (post-traumatic stress disorder)   . UTI (lower urinary tract infection)     Family History  Problem Relation Age of Onset  . Cancer Mother   . Heart disease Father     Past Surgical History:  Procedure Laterality Date  . CERVICAL DISC ARTHROPLASTY    . LUMBAR LAMINECTOMY Right 04/01/2020   Procedure: RIGHT Lumbar 5-sacral 1 MICRODISCECTOMY;  Surgeon: Eldred Manges, MD;  Location: [facility] OR;  Service: Orthopedics;  Laterality: Right;  . TONSILLECTOMY     Social History   Occupational History  . Not on file  Tobacco Use  . Smoking status: Current Every Day Smoker    Packs/day: 1.00     Types: Cigarettes  . Smokeless tobacco: Never Used  Vaping Use  . Vaping Use: Never used  Substance and Sexual Activity  . Alcohol use: Yes    Comment: 1-2x/month  . Drug use: No  . Sexual activity: Not on file

## 2020-06-08 ENCOUNTER — Other Ambulatory Visit: Payer: Self-pay | Admitting: Emergency Medicine

## 2020-06-08 DIAGNOSIS — E119 Type 2 diabetes mellitus without complications: Secondary | ICD-10-CM

## 2020-06-08 NOTE — Telephone Encounter (Signed)
Requested Prescriptions  Pending Prescriptions Disp Refills  . glipiZIDE (GLUCOTROL) 5 MG tablet [Pharmacy Med Name: GLIPIZIDE 5 MG TABLET] 180 tablet 1    Sig: TAKE 1 TABLET BY MOUTH TWICE A DAY WITH MEALS     Endocrinology:  Diabetes - Sulfonylureas Passed - 06/08/2020  2:14 AM      Passed - HBA1C is between 0 and 7.9 and within 180 days    Hemoglobin A1C  Date Value Ref Range Status  03/16/2020 5.7 (A) 4.0 - 5.6 % Final   Hgb A1c MFr Bld  Date Value Ref Range Status  12/15/2019 8.3 (H) 4.8 - 5.6 % Final    Comment:    (NOTE) Pre diabetes:          5.7%-6.4% Diabetes:              >6.4% Glycemic control for   <7.0% adults with diabetes          Passed - Valid encounter within last 6 months    Recent Outpatient Visits          2 months ago Type 2 diabetes mellitus with hyperglycemia, without long-term current use of insulin [facility])   Primary Care at [facility], Eilleen Kempf, MD   4 months ago HNP (herniated nucleus pulposus), lumbar   Primary Care at [facility], [place], [place]   5 months ago New onset type 2 diabetes mellitus [facility])   Primary Care at [place], Eilleen Kempf, MD   6 months ago Sciatica of right side   Primary Care at [place], Eilleen Kempf, MD   2 years ago Routine general medical examination at a health care facility   Primary Care at [facility], Eilleen Kempf, MD      Future Appointments            In 3 months [place], Eilleen Kempf, MD Primary Care at [place], [facility]

## 2020-06-17 ENCOUNTER — Encounter: Payer: Self-pay | Admitting: Emergency Medicine

## 2020-06-17 ENCOUNTER — Telehealth: Payer: Self-pay | Admitting: Emergency Medicine

## 2020-06-17 NOTE — Telephone Encounter (Signed)
Pt is requesting a covered meter I do not see previous order for testing supplies is this appropriate? Should I order him the Kit?

## 2020-06-17 NOTE — Telephone Encounter (Signed)
LVM for patient to return call and schedule an appt to discuss Dexcom monitor systems.

## 2020-06-17 NOTE — Telephone Encounter (Signed)
What is the name of the medication?Dexcom or Omni pad meter   Have you contacted your pharmacy to request a refill? No he wants to switch to one of these meters.  Which pharmacy would you like this sent to? Pharmacy  [id number] [place], [place] - [address]  [address] [place] [address]  Phone:  [phone] Fax:  [phone]       Patient notified that their request is being sent to the clinical staff for review and that they should receive a call once it is complete. If they do not receive a call within 72 hours they can check with their pharmacy or our office.

## 2020-06-19 NOTE — Telephone Encounter (Signed)
It is appropriate.  Order him the kit please.  Thanks.

## 2020-06-20 MED ORDER — DEXCOM G6 TRANSMITTER MISC: 1 refills | Status: DC

## 2020-06-20 MED ORDER — DEXCOM G5 RECEIVER KIT DEVI: 0 refills | Status: DC

## 2020-06-20 MED ORDER — DEXCOM G6 SENSOR MISC: 0 refills | Status: DC

## 2020-06-20 MED ORDER — DEXCOM G6 RECEIVER DEVI: 0 refills | Status: AC

## 2020-07-15 ENCOUNTER — Other Ambulatory Visit: Payer: Self-pay | Admitting: Emergency Medicine

## 2020-08-27 ENCOUNTER — Other Ambulatory Visit: Payer: Self-pay | Admitting: Emergency Medicine

## 2020-08-27 NOTE — Telephone Encounter (Signed)
Requested Prescriptions  Pending Prescriptions Disp Refills  . Continuous Blood Gluc Sensor (DEXCOM G6 SENSOR) MISC [Pharmacy Med Name: DEXCOM G6 SENSOR] 3 each 0    Sig: USE TO TEST 1-2 TIMES DAILY     Endocrinology: Diabetes - Testing Supplies Passed - 08/27/2020  8:39 AM      Passed - Valid encounter within last 12 months    Recent Outpatient Visits          5 months ago Type 2 diabetes mellitus with hyperglycemia, without long-term current use of insulin [facility])   Primary Care at [facility], Eilleen Kempf, MD   7 months ago HNP (herniated nucleus pulposus), lumbar   Primary Care at [facility], [place], MD   8 months ago New onset type 2 diabetes mellitus [facility])   Primary Care at [facility], Eilleen Kempf, MD   9 months ago Sciatica of right side   Primary Care at [place], Eilleen Kempf, MD   2 years ago Routine general medical examination at a health care facility   Primary Care at [facility], Eilleen Kempf, MD      Future Appointments            In 2 weeks Sagardia, Eilleen Kempf, MD Primary Care at [place], [facility]

## 2020-09-12 ENCOUNTER — Ambulatory Visit (INDEPENDENT_AMBULATORY_CARE_PROVIDER_SITE_OTHER): Payer: Managed Care, Other (non HMO) | Admitting: Emergency Medicine

## 2020-09-12 ENCOUNTER — Encounter: Payer: Self-pay | Admitting: Emergency Medicine

## 2020-09-12 ENCOUNTER — Other Ambulatory Visit: Payer: Self-pay

## 2020-09-12 VITALS — BP 122/82 | HR 73 | Temp 98.2°F | Resp 16 | Ht 75.0 in | Wt 295.0 lb

## 2020-09-12 DIAGNOSIS — E1169 Type 2 diabetes mellitus with other specified complication: Secondary | ICD-10-CM

## 2020-09-12 DIAGNOSIS — E785 Hyperlipidemia, unspecified: Secondary | ICD-10-CM

## 2020-09-12 DIAGNOSIS — E1165 Type 2 diabetes mellitus with hyperglycemia: Secondary | ICD-10-CM | POA: Diagnosis not present

## 2020-09-12 LAB — POCT GLYCOSYLATED HEMOGLOBIN (HGB A1C): Hemoglobin A1C: 5.9 % — AB (ref 4.0–5.6)

## 2020-09-12 LAB — GLUCOSE, POCT (MANUAL RESULT ENTRY): POC Glucose: 111 mg/dl — AB (ref 70–99)

## 2020-09-12 MED ORDER — ROSUVASTATIN CALCIUM 10 MG PO TABS: 10.0000 mg | ORAL_TABLET | Freq: Every day | ORAL | 3 refills | Status: AC

## 2020-09-12 NOTE — Assessment & Plan Note (Signed)
Well-controlled diabetes with hemoglobin A1c of 5.9. Continue Metformin twice a day. Continue rosuvastatin. Diet and nutrition discussed with patient. Follow-up in 6 months.

## 2020-09-12 NOTE — Progress Notes (Signed)
Jewel Baize 44 y.o.   Chief Complaint  Patient presents with  . Diabetes    Follow up 6 months    HISTORY OF PRESENT ILLNESS: This is a 44 y.o. male with history of diabetes here for 18-month follow-up. Doing well.  Stopped glipizide due to hypoglycemic events in the 40s. Only taking Metformin 1000 mg twice a day.  Lowest blood sugar in the 60s.  Much better. Has no complaints or medical concerns today. Fully vaccinated against Covid. Lab Results  Component Value Date   HGBA1C 5.7 (A) 03/16/2020    HPI   Prior to Admission medications   Medication Sig Start Date End Date Taking? Authorizing Provider  metFORMIN (GLUCOPHAGE) 1000 MG tablet Take 1 tablet (1,000 mg total) by mouth 2 (two) times daily with a meal. 12/16/19  Yes Oriel Rumbold, Eilleen Kempf, MD  Continuous Blood Gluc Receiver (DEXCOM G6 RECEIVER) DEVI Test 1 to 2 times daily 06/20/20   Georgina Quint, MD  Continuous Blood Gluc Sensor (DEXCOM G6 SENSOR) MISC USE TO TEST 1-2 TIMES DAILY 08/27/20   Georgina Quint, MD  Continuous Blood Gluc Transmit (DEXCOM G6 TRANSMITTER) MISC Test 1 to 2 times daily 06/20/20   Georgina Quint, MD  glipiZIDE (GLUCOTROL) 5 MG tablet TAKE 1 TABLET BY MOUTH TWICE A DAY WITH MEALS Patient not taking: Reported on 09/12/2020 06/08/20   Georgina Quint, MD  methocarbamol (ROBAXIN) 500 MG tablet Take 1 tablet (500 mg total) by mouth every 6 (six) hours as needed for muscle spasms. Patient not taking: Reported on 09/12/2020 04/01/20   Naida Sleight, PA-C  oxyCODONE-acetaminophen (PERCOCET/ROXICET) 5-325 MG tablet Take 1-2 tablets by mouth every 4 (four) hours as needed for severe pain. Patient not taking: Reported on 09/12/2020 04/01/20   Naida Sleight, PA-C  oxyCODONE-acetaminophen (PERCOCET/ROXICET) 5-325 MG tablet Take 1 tablet by mouth every 6 (six) hours as needed for severe pain. Patient not taking: Reported on 09/12/2020 04/07/20   Naida Sleight, PA-C  rosuvastatin (CRESTOR) 10  MG tablet Take 1 tablet (10 mg total) by mouth daily. Patient not taking: Reported on 09/12/2020 03/16/20   Georgina Quint, MD    No Known Allergies  Patient Active Problem List   Diagnosis Date Noted  . Status post lumbar microdiscectomy 05/31/2020  . New onset type 2 diabetes mellitus (HCC) 12/16/2019    Past Medical History:  Diagnosis Date  . Depression   . Headache    MIGRAINES  . Pre-diabetes   . PTSD (post-traumatic stress disorder)   . UTI (lower urinary tract infection)     Past Surgical History:  Procedure Laterality Date  . CERVICAL DISC ARTHROPLASTY    . LUMBAR LAMINECTOMY Right 04/01/2020   Procedure: RIGHT Lumbar 5-sacral 1 MICRODISCECTOMY;  Surgeon: Eldred Manges, MD;  Location: [facility] OR;  Service: Orthopedics;  Laterality: Right;  . TONSILLECTOMY      Social History   Socioeconomic History  . Marital status: Married    Spouse name: Not on file  . Number of children: Not on file  . Years of education: Not on file  . Highest education level: Not on file  Occupational History  . Not on file  Tobacco Use  . Smoking status: Current Every Day Smoker    Packs/day: 1.00    Types: Cigarettes  . Smokeless tobacco: Never Used  Vaping Use  . Vaping Use: Never used  Substance and Sexual Activity  . Alcohol use: Yes    Comment: 1-2x/month  .  Drug use: No  . Sexual activity: Not on file  Other Topics Concern  . Not on file  Social History Narrative  . Not on file   Social Determinants of Health   Financial Resource Strain: Not on file  Food Insecurity: Not on file  Transportation Needs: Not on file  Physical Activity: Not on file  Stress: Not on file  Social Connections: Not on file  Intimate Partner Violence: Not on file    Family History  Problem Relation Age of Onset  . Cancer Mother   . Heart disease Father      Review of Systems  Constitutional: Negative.  Negative for chills and fever.  HENT: Negative.  Negative for congestion and  sore throat.   Respiratory: Negative.  Negative for cough and shortness of breath.   Cardiovascular: Negative.  Negative for chest pain and palpitations.  Gastrointestinal: Negative.  Negative for abdominal pain, diarrhea, nausea and vomiting.  Genitourinary: Negative.  Negative for dysuria and hematuria.  Musculoskeletal: Negative.  Negative for back pain, myalgias and neck pain.  Skin: Negative.  Negative for rash.  Neurological: Negative.  Negative for dizziness and headaches.  All other systems reviewed and are negative.   Today's Vitals   09/12/20 0853  BP: 122/82  Pulse: 73  Resp: 16  Temp: 98.2 F (36.8 C)  TempSrc: Temporal  SpO2: 96%  Weight: 295 lb (133.8 kg)  Height: 6\' 3"  (1.905 m)   Body mass index is 36.87 kg/m. Wt Readings from Last 3 Encounters:  09/12/20 295 lb (133.8 kg)  05/31/20 300 lb (136.1 kg)  04/07/20 300 lb (136.1 kg)    Physical Exam Vitals reviewed.  Constitutional:      Appearance: Normal appearance.  HENT:     Head: Normocephalic.  Eyes:     Extraocular Movements: Extraocular movements intact.     Conjunctiva/sclera: Conjunctivae normal.     Pupils: Pupils are equal, round, and reactive to light.  Cardiovascular:     Rate and Rhythm: Normal rate and regular rhythm.     Pulses: Normal pulses.     Heart sounds: Normal heart sounds.  Pulmonary:     Effort: Pulmonary effort is normal.     Breath sounds: Normal breath sounds.  Abdominal:     Palpations: Abdomen is soft.     Tenderness: There is no abdominal tenderness.  Musculoskeletal:        General: Normal range of motion.     Cervical back: Normal range of motion and neck supple.  Skin:    General: Skin is warm and dry.     Capillary Refill: Capillary refill takes less than 2 seconds.  Neurological:     General: No focal deficit present.     Mental Status: He is alert and oriented to person, place, and time.  Psychiatric:        Mood and Affect: Mood normal.        Behavior:  Behavior normal.    Results for orders placed or performed in visit on 09/12/20 (from the past 24 hour(s))  POCT glucose (manual entry)     Status: Abnormal   Collection Time: 09/12/20  9:24 AM  Result Value Ref Range   POC Glucose 111 (A) 70 - 99 mg/dl  POCT glycosylated hemoglobin (Hb A1C)     Status: Abnormal   Collection Time: 09/12/20  9:29 AM  Result Value Ref Range   Hemoglobin A1C 5.9 (A) 4.0 - 5.6 %   HbA1c POC (<>  result, manual entry)     HbA1c, POC (prediabetic range)     HbA1c, POC (controlled diabetic range)       ASSESSMENT & PLAN: Dyslipidemia associated with type 2 diabetes mellitus (HCC) Well-controlled diabetes with hemoglobin A1c of 5.9. Continue Metformin twice a day. Continue rosuvastatin. Diet and nutrition discussed with patient. Follow-up in 6 months.  Rossi was seen today for diabetes.  Diagnoses and all orders for this visit:  Type 2 diabetes mellitus with hyperglycemia, without long-term current use of insulin (HCC) -     Comprehensive metabolic panel -     POCT glucose (manual entry) -     POCT glycosylated hemoglobin (Hb A1C) -     Lipid panel -     Ambulatory referral to Ophthalmology -     HM Diabetes Foot Exam  Dyslipidemia associated with type 2 diabetes mellitus (HCC) -     rosuvastatin (CRESTOR) 10 MG tablet; Take 1 tablet (10 mg total) by mouth daily.  Dyslipidemia    Patient Instructions       If you have lab work done today you will be contacted with your lab results within the next 2 weeks.  If you have not heard from [place] then please contact us. The fastest way to get your results is to register for My Chart.   IF you received an x-ray today, you will receive an invoice from [facility]. Please contact [facility] at [phone] with questions or concerns regarding your invoice.   IF you received labwork today, you will receive an invoice from [place]. Please contact [facility] at [phone] with  questions or concerns regarding your invoice.   Our billing staff will not be able to assist you with questions regarding bills from these companies.  You will be contacted with the lab results as soon as they are available. The fastest way to get your results is to activate your My Chart account. Instructions are located on the last page of this paperwork. If you have not heard from [place] regarding the results in 2 weeks, please contact this office.      Diabetes Mellitus and Nutrition, Adult When you have diabetes (diabetes mellitus), it is very important to have healthy eating habits because your blood sugar (glucose) levels are greatly affected by what you eat and drink. Eating healthy foods in the appropriate amounts, at about the same times every day, can help you:  Control your blood glucose.  Lower your risk of heart disease.  Improve your blood pressure.  Reach or maintain a healthy weight. Every person with diabetes is different, and each person has different needs for a meal plan. Your health care provider may recommend that you work with a diet and nutrition specialist (dietitian) to make a meal plan that is best for you. Your meal plan may vary depending on factors such as:  The calories you need.  The medicines you take.  Your weight.  Your blood glucose, blood pressure, and cholesterol levels.  Your activity level.  Other health conditions you have, such as heart or kidney disease. How do carbohydrates affect me? Carbohydrates, also called carbs, affect your blood glucose level more than any other type of food. Eating carbs naturally raises the amount of glucose in your blood. Carb counting is a method for keeping track of how many carbs you eat. Counting carbs is important to keep your blood glucose at a healthy level, especially if you use insulin or take certain oral diabetes medicines.  It is important to know how many carbs you can safely have in each meal. This is  different for every person. Your dietitian can help you calculate how many carbs you should have at each meal and for each snack. Foods that contain carbs include:  Bread, cereal, rice, pasta, and crackers.  Potatoes and corn.  Peas, beans, and lentils.  Milk and yogurt.  Fruit and juice.  Desserts, such as cakes, cookies, ice cream, and candy. How does alcohol affect me? Alcohol can cause a sudden decrease in blood glucose (hypoglycemia), especially if you use insulin or take certain oral diabetes medicines. Hypoglycemia can be a life-threatening condition. Symptoms of hypoglycemia (sleepiness, dizziness, and confusion) are similar to symptoms of having too much alcohol. If your health care provider says that alcohol is safe for you, follow these guidelines:  Limit alcohol intake to no more than 1 drink per day for nonpregnant women and 2 drinks per day for men. One drink equals 12 oz of beer, 5 oz of wine, or 1 oz of hard liquor.  Do not drink on an empty stomach.  Keep yourself hydrated with water, diet soda, or unsweetened iced tea.  Keep in mind that regular soda, juice, and other mixers may contain a lot of sugar and must be counted as carbs. What are tips for following this plan?  Reading food labels  Start by checking the serving size on the "Nutrition Facts" label of packaged foods and drinks. The amount of calories, carbs, fats, and other nutrients listed on the label is based on one serving of the item. Many items contain more than one serving per package.  Check the total grams (g) of carbs in one serving. You can calculate the number of servings of carbs in one serving by dividing the total carbs by 15. For example, if a food has 30 g of total carbs, it would be equal to 2 servings of carbs.  Check the number of grams (g) of saturated and trans fats in one serving. Choose foods that have low or no amount of these fats.  Check the number of milligrams (mg) of salt  (sodium) in one serving. Most people should limit total sodium intake to less than 2,300 mg per day.  Always check the nutrition information of foods labeled as "low-fat" or "nonfat". These foods may be higher in added sugar or refined carbs and should be avoided.  Talk to your dietitian to identify your daily goals for nutrients listed on the label. Shopping  Avoid buying canned, premade, or processed foods. These foods tend to be high in fat, sodium, and added sugar.  Shop around the outside edge of the grocery store. This includes fresh fruits and vegetables, bulk grains, fresh meats, and fresh dairy. Cooking  Use low-heat cooking methods, such as baking, instead of high-heat cooking methods like deep frying.  Cook using healthy oils, such as olive, canola, or sunflower oil.  Avoid cooking with butter, cream, or high-fat meats. Meal planning  Eat meals and snacks regularly, preferably at the same times every day. Avoid going long periods of time without eating.  Eat foods high in fiber, such as fresh fruits, vegetables, beans, and whole grains. Talk to your dietitian about how many servings of carbs you can eat at each meal.  Eat 4-6 ounces (oz) of lean protein each day, such as lean meat, chicken, fish, eggs, or tofu. One oz of lean protein is equal to: ? 1 oz of meat, chicken,  or fish. ? 1 egg. ?  cup of tofu.  Eat some foods each day that contain healthy fats, such as avocado, nuts, seeds, and fish. Lifestyle  Check your blood glucose regularly.  Exercise regularly as told by your health care provider. This may include: ? 150 minutes of moderate-intensity or vigorous-intensity exercise each week. This could be brisk walking, biking, or water aerobics. ? Stretching and doing strength exercises, such as yoga or weightlifting, at least 2 times a week.  Take medicines as told by your health care provider.  Do not use any products that contain nicotine or tobacco, such as  cigarettes and e-cigarettes. If you need help quitting, ask your health care provider.  Work with a Veterinary surgeoncounselor or diabetes educator to identify strategies to manage stress and any emotional and social challenges. Questions to ask a health care provider  Do I need to meet with a diabetes educator?  Do I need to meet with a dietitian?  What number can I call if I have questions?  When are the best times to check my blood glucose? Where to find more information:  American Diabetes Association: diabetes.org  Academy of Nutrition and Dietetics: www.eatright.[employer]  National Institute of Diabetes and Digestive and Kidney Diseases (NIH): [employer].tnwww.niddk.nih.gov Summary  A healthy meal plan will help you control your blood glucose and maintain a healthy lifestyle.  Working with a diet and nutrition specialist (dietitian) can help you make a meal plan that is best for you.  Keep in mind that carbohydrates (carbs) and alcohol have immediate effects on your blood glucose levels. It is important to count carbs and to use alcohol carefully. This information is not intended to replace advice given to you by your health care provider. Make sure you discuss any questions you have with your health care provider. Document Revised: 08/23/2017 Document Reviewed: 10/15/2016 Elsevier Patient Education  2020 Elsevier Inc.      Edwina BarthMiguel Willy Pinkerton, MD [facility]

## 2020-09-12 NOTE — Patient Instructions (Addendum)
   If you have lab work done today you will be contacted with your lab results within the next 2 weeks.  If you have not heard from us then please contact us. The fastest way to get your results is to register for My Chart.   IF you received an x-ray today, you will receive an invoice from [facility]. Please contact [facility] at [phone] with questions or concerns regarding your invoice.   IF you received labwork today, you will receive an invoice from [facility]. Please contact [facility] at [phone] with questions or concerns regarding your invoice.   Our billing staff will not be able to assist you with questions regarding bills from these companies.  You will be contacted with the lab results as soon as they are available. The fastest way to get your results is to activate your My Chart account. Instructions are located on the last page of this paperwork. If you have not heard from us regarding the results in 2 weeks, please contact this office.      Diabetes Mellitus and Nutrition, Adult When you have diabetes (diabetes mellitus), it is very important to have healthy eating habits because your blood sugar (glucose) levels are greatly affected by what you eat and drink. Eating healthy foods in the appropriate amounts, at about the same times every day, can help you:  Control your blood glucose.  Lower your risk of heart disease.  Improve your blood pressure.  Reach or maintain a healthy weight. Every person with diabetes is different, and each person has different needs for a meal plan. Your health care provider may recommend that you work with a diet and nutrition specialist (dietitian) to make a meal plan that is best for you. Your meal plan may vary depending on factors such as:  The calories you need.  The medicines you take.  Your weight.  Your blood glucose, blood pressure, and cholesterol levels.  Your activity level.  Other health  conditions you have, such as heart or kidney disease. How do carbohydrates affect me? Carbohydrates, also called carbs, affect your blood glucose level more than any other type of food. Eating carbs naturally raises the amount of glucose in your blood. Carb counting is a method for keeping track of how many carbs you eat. Counting carbs is important to keep your blood glucose at a healthy level, especially if you use insulin or take certain oral diabetes medicines. It is important to know how many carbs you can safely have in each meal. This is different for every person. Your dietitian can help you calculate how many carbs you should have at each meal and for each snack. Foods that contain carbs include:  Bread, cereal, rice, pasta, and crackers.  Potatoes and corn.  Peas, beans, and lentils.  Milk and yogurt.  Fruit and juice.  Desserts, such as cakes, cookies, ice cream, and candy. How does alcohol affect me? Alcohol can cause a sudden decrease in blood glucose (hypoglycemia), especially if you use insulin or take certain oral diabetes medicines. Hypoglycemia can be a life-threatening condition. Symptoms of hypoglycemia (sleepiness, dizziness, and confusion) are similar to symptoms of having too much alcohol. If your health care provider says that alcohol is safe for you, follow these guidelines:  Limit alcohol intake to no more than 1 drink per day for nonpregnant women and 2 drinks per day for men. One drink equals 12 oz of beer, 5 oz of wine, or 1 oz of hard   liquor.  Do not drink on an empty stomach.  Keep yourself hydrated with water, diet soda, or unsweetened iced tea.  Keep in mind that regular soda, juice, and other mixers may contain a lot of sugar and must be counted as carbs. What are tips for following this plan?  Reading food labels  Start by checking the serving size on the "Nutrition Facts" label of packaged foods and drinks. The amount of calories, carbs, fats, and  other nutrients listed on the label is based on one serving of the item. Many items contain more than one serving per package.  Check the total grams (g) of carbs in one serving. You can calculate the number of servings of carbs in one serving by dividing the total carbs by 15. For example, if a food has 30 g of total carbs, it would be equal to 2 servings of carbs.  Check the number of grams (g) of saturated and trans fats in one serving. Choose foods that have low or no amount of these fats.  Check the number of milligrams (mg) of salt (sodium) in one serving. Most people should limit total sodium intake to less than 2,300 mg per day.  Always check the nutrition information of foods labeled as "low-fat" or "nonfat". These foods may be higher in added sugar or refined carbs and should be avoided.  Talk to your dietitian to identify your daily goals for nutrients listed on the label. Shopping  Avoid buying canned, premade, or processed foods. These foods tend to be high in fat, sodium, and added sugar.  Shop around the outside edge of the grocery store. This includes fresh fruits and vegetables, bulk grains, fresh meats, and fresh dairy. Cooking  Use low-heat cooking methods, such as baking, instead of high-heat cooking methods like deep frying.  Cook using healthy oils, such as olive, canola, or sunflower oil.  Avoid cooking with butter, cream, or high-fat meats. Meal planning  Eat meals and snacks regularly, preferably at the same times every day. Avoid going long periods of time without eating.  Eat foods high in fiber, such as fresh fruits, vegetables, beans, and whole grains. Talk to your dietitian about how many servings of carbs you can eat at each meal.  Eat 4-6 ounces (oz) of lean protein each day, such as lean meat, chicken, fish, eggs, or tofu. One oz of lean protein is equal to: ? 1 oz of meat, chicken, or fish. ? 1 egg. ?  cup of tofu.  Eat some foods each day that  contain healthy fats, such as avocado, nuts, seeds, and fish. Lifestyle  Check your blood glucose regularly.  Exercise regularly as told by your health care provider. This may include: ? 150 minutes of moderate-intensity or vigorous-intensity exercise each week. This could be brisk walking, biking, or water aerobics. ? Stretching and doing strength exercises, such as yoga or weightlifting, at least 2 times a week.  Take medicines as told by your health care provider.  Do not use any products that contain nicotine or tobacco, such as cigarettes and e-cigarettes. If you need help quitting, ask your health care provider.  Work with a counselor or diabetes educator to identify strategies to manage stress and any emotional and social challenges. Questions to ask a health care provider  Do I need to meet with a diabetes educator?  Do I need to meet with a dietitian?  What number can I call if I have questions?  When are the best   times to check my blood glucose? Where to find more information:  American Diabetes Association: diabetes.org  Academy of Nutrition and Dietetics: www.eatright.org  National Institute of Diabetes and Digestive and Kidney Diseases (NIH): www.niddk.nih.gov Summary  A healthy meal plan will help you control your blood glucose and maintain a healthy lifestyle.  Working with a diet and nutrition specialist (dietitian) can help you make a meal plan that is best for you.  Keep in mind that carbohydrates (carbs) and alcohol have immediate effects on your blood glucose levels. It is important to count carbs and to use alcohol carefully. This information is not intended to replace advice given to you by your health care provider. Make sure you discuss any questions you have with your health care provider. Document Revised: 08/23/2017 Document Reviewed: 10/15/2016 Elsevier Patient Education  2020 Elsevier Inc.  

## 2020-09-13 LAB — COMPREHENSIVE METABOLIC PANEL
ALT: 60 IU/L — ABNORMAL HIGH (ref 0–44)
AST: 31 IU/L (ref 0–40)
Albumin/Globulin Ratio: 2 (ref 1.2–2.2)
Albumin: 4.5 g/dL (ref 4.0–5.0)
Alkaline Phosphatase: 60 IU/L (ref 44–121)
BUN/Creatinine Ratio: 20 (ref 9–20)
BUN: 20 mg/dL (ref 6–24)
Bilirubin Total: 0.4 mg/dL (ref 0.0–1.2)
CO2: 22 mmol/L (ref 20–29)
Calcium: 9.8 mg/dL (ref 8.7–10.2)
Chloride: 104 mmol/L (ref 96–106)
Creatinine, Ser: 1 mg/dL (ref 0.76–1.27)
GFR calc Af Amer: 104 mL/min/{1.73_m2} (ref >59)
GFR calc non Af Amer: 90 mL/min/{1.73_m2} (ref >59)
Globulin, Total: 2.3 g/dL (ref 1.5–4.5)
Glucose: 117 mg/dL — ABNORMAL HIGH (ref 65–99)
Potassium: 4.6 mmol/L (ref 3.5–5.2)
Sodium: 140 mmol/L (ref 134–144)
Total Protein: 6.8 g/dL (ref 6.0–8.5)

## 2020-09-13 LAB — LIPID PANEL
Chol/HDL Ratio: 5.9 ratio — ABNORMAL HIGH (ref 0.0–5.0)
Cholesterol, Total: 194 mg/dL (ref 100–199)
HDL: 33 mg/dL — ABNORMAL LOW (ref >39)
LDL Chol Calc (NIH): 120 mg/dL — ABNORMAL HIGH (ref 0–99)
Triglycerides: 235 mg/dL — ABNORMAL HIGH (ref 0–149)
VLDL Cholesterol Cal: 41 mg/dL — ABNORMAL HIGH (ref 5–40)

## 2020-09-28 ENCOUNTER — Other Ambulatory Visit: Payer: Self-pay | Admitting: Emergency Medicine

## 2020-10-27 LAB — HM DIABETES EYE EXAM

## 2020-10-31 ENCOUNTER — Encounter: Payer: Self-pay | Admitting: *Deleted

## 2020-11-14 ENCOUNTER — Encounter: Payer: Self-pay | Admitting: *Deleted

## 2020-12-11 ENCOUNTER — Other Ambulatory Visit: Payer: Self-pay | Admitting: Emergency Medicine

## 2020-12-11 DIAGNOSIS — E119 Type 2 diabetes mellitus without complications: Secondary | ICD-10-CM

## 2020-12-11 NOTE — Telephone Encounter (Signed)
Requested Prescriptions  Pending Prescriptions Disp Refills  . metFORMIN (GLUCOPHAGE) 1000 MG tablet [Pharmacy Med Name: METFORMIN HCL 1,000 MG TABLET] 180 tablet 0    Sig: TAKE 1 TABLET BY MOUTH TWICE A DAY WITH MEALS     Endocrinology:  Diabetes - Biguanides Passed - 12/11/2020  8:58 AM      Passed - Cr in normal range and within 360 days    Creatinine, Ser  Date Value Ref Range Status  09/12/2020 1.00 0.76 - 1.27 mg/dL Final         Passed - HBA1C is between 0 and 7.9 and within 180 days    Hemoglobin A1C  Date Value Ref Range Status  09/12/2020 5.9 (A) 4.0 - 5.6 % Final   Hgb A1c MFr Bld  Date Value Ref Range Status  12/15/2019 8.3 (H) 4.8 - 5.6 % Final    Comment:    (NOTE) Pre diabetes:          5.7%-6.4% Diabetes:              >6.4% Glycemic control for   <7.0% adults with diabetes          Passed - eGFR in normal range and within 360 days    GFR calc Af Amer  Date Value Ref Range Status  09/12/2020 104 >59 mL/min/1.73 Final    Comment:    **In accordance with recommendations from the NKF-ASN Task force,**   [facility] is in the process of updating its eGFR calculation to the   2021 CKD-EPI creatinine equation that estimates kidney function   without a race variable.    GFR calc non Af Amer  Date Value Ref Range Status  09/12/2020 90 >59 mL/min/1.73 Final         Passed - Valid encounter within last 6 months    Recent Outpatient Visits          3 months ago Type 2 diabetes mellitus with hyperglycemia, without long-term current use of insulin [facility])   Primary Care at [facility], [place], [place]   9 months ago Type 2 diabetes mellitus with hyperglycemia, without long-term current use of insulin [facility])   Primary Care at [facility], Ines Bloomer, [place]   11 months ago HNP (herniated nucleus pulposus), lumbar   Primary Care at [facility], [place], [place]   12 months ago New onset type 2 diabetes mellitus [facility])   Primary Care at [place], Ines Bloomer, [place]   1 year ago Sciatica of right side   Primary Care at [facility], [place], [place]      Future Appointments            In 3 months [place], [place], [place] at [facility]

## 2021-01-03 ENCOUNTER — Other Ambulatory Visit: Payer: Self-pay | Admitting: Emergency Medicine

## 2021-01-08 IMAGING — CR DG LUMBAR SPINE 2-3V
2 series · 2 of 2 positions shown · non-contrast
Comparison: [DATE] [DATE], [DATE].  [DATE] [DATE], [DATE].

CLINICAL DATA: L5-S1 micro discectomy.

EXAM:
LUMBAR SPINE - 2-3 VIEW

[lateral (1 of 2)]
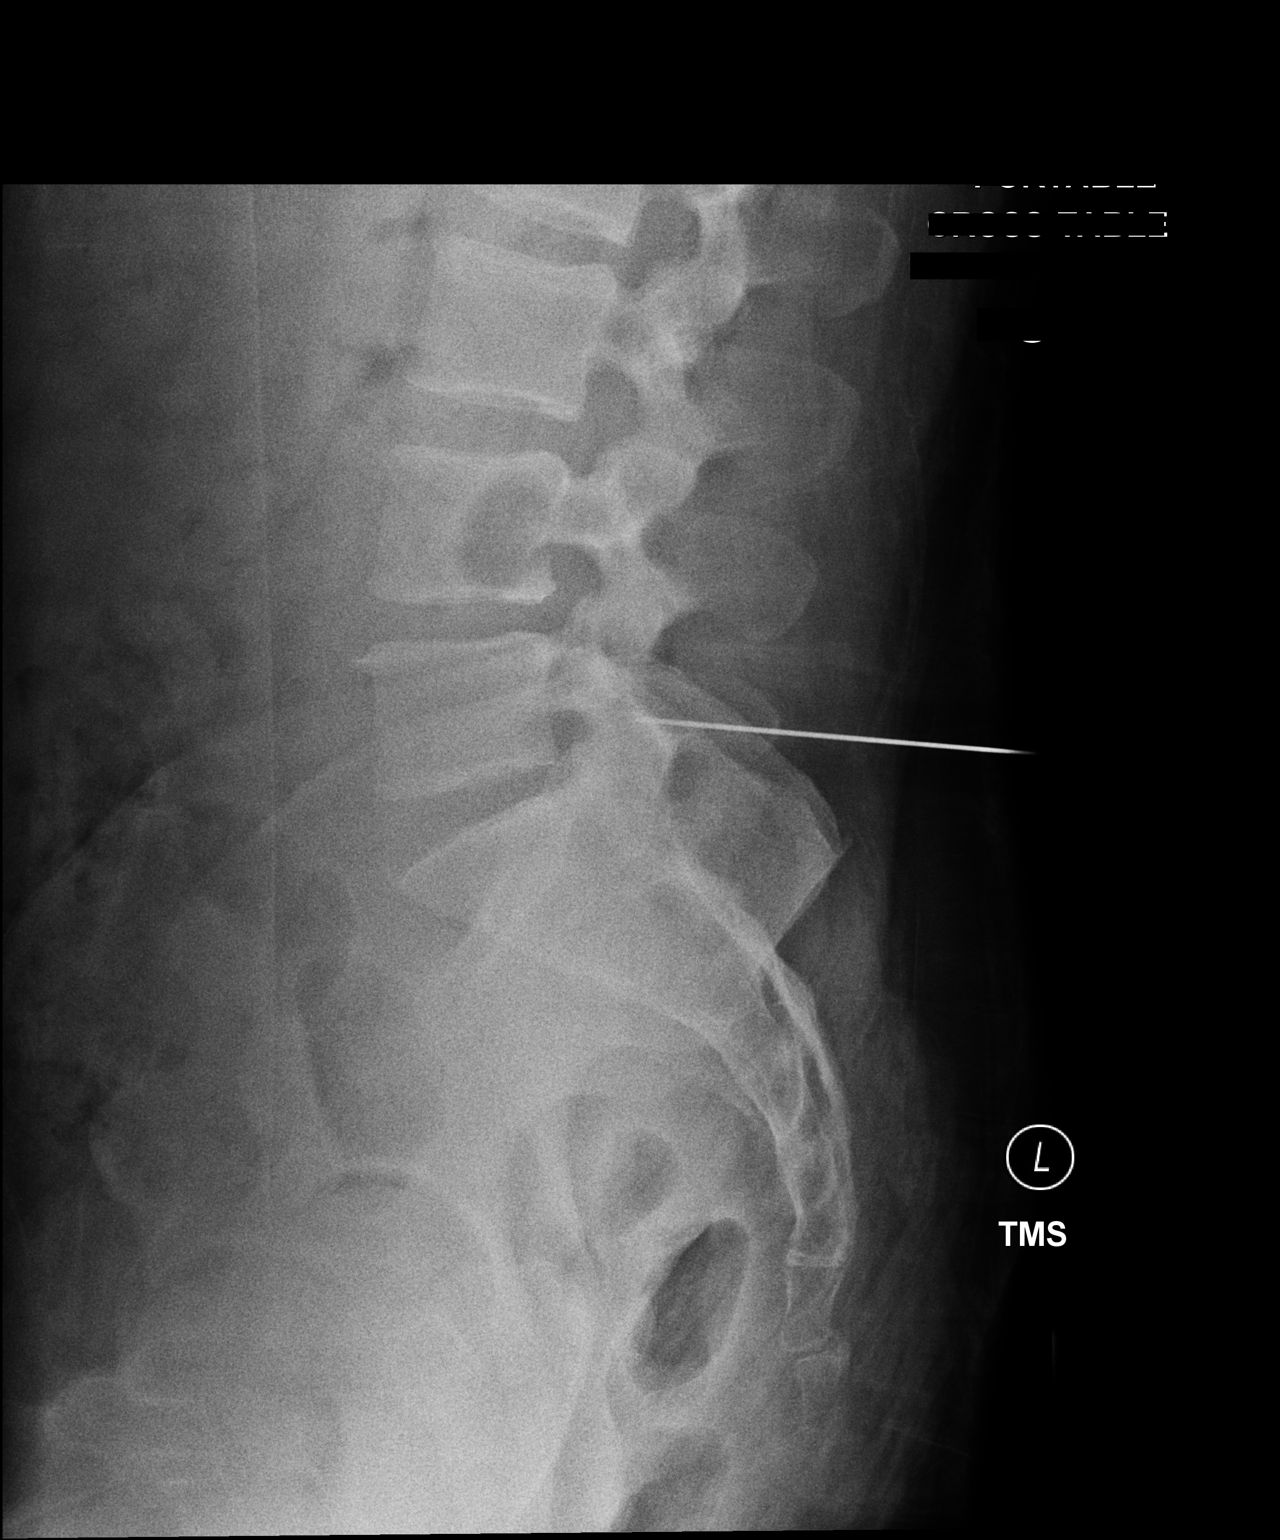

[lateral (2 of 2)]
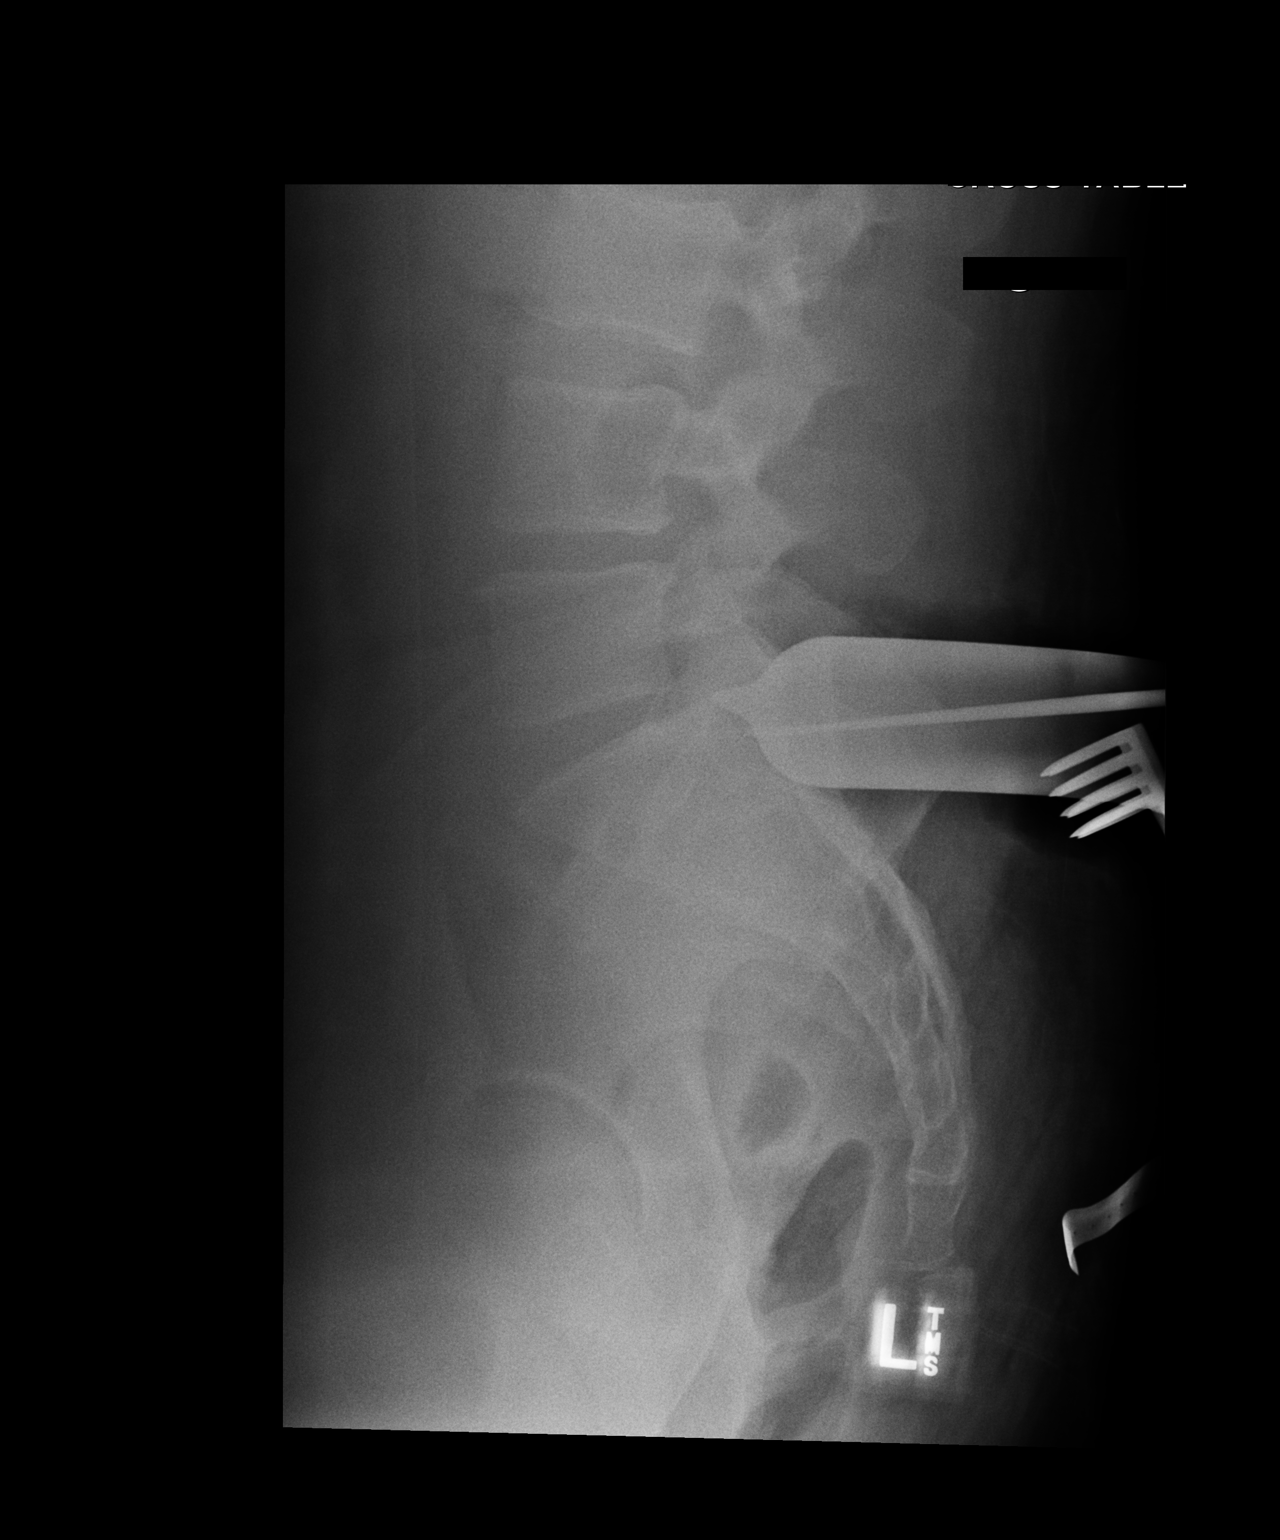

[2 of 2 positions shown; findings below may reference images not displayed]

FINDINGS: Two intraoperative lateral projections were obtained of the lumbar
spine. The first image demonstrates surgical needle directed toward
posterior elements of L5. The second image demonstrates surgical
probe directed toward posterior portion of L5-S1.
IMPRESSION: Surgical localization as described above.

## 2021-03-13 ENCOUNTER — Ambulatory Visit: Payer: Self-pay | Admitting: Emergency Medicine

## 2021-03-13 DIAGNOSIS — Z0289 Encounter for other administrative examinations: Secondary | ICD-10-CM

## 2021-03-22 ENCOUNTER — Other Ambulatory Visit: Payer: Self-pay | Admitting: Emergency Medicine

## 2021-03-22 DIAGNOSIS — E119 Type 2 diabetes mellitus without complications: Secondary | ICD-10-CM

## 2021-04-07 ENCOUNTER — Other Ambulatory Visit: Payer: Self-pay | Admitting: Emergency Medicine

## 2021-04-28 ENCOUNTER — Telehealth: Payer: Self-pay

## 2021-04-28 NOTE — Telephone Encounter (Signed)
Received a fax from [facility] to close a potential gap in therapy as the patient has not refilled Rosuvastatin since 09/12/20.  Upon review of EMR, pt did not show up for 37mo f/u.  Pt needs appt for DM & dyslipidemia.

## 2021-06-11 ENCOUNTER — Other Ambulatory Visit: Payer: Self-pay | Admitting: Emergency Medicine

## 2021-06-11 DIAGNOSIS — E119 Type 2 diabetes mellitus without complications: Secondary | ICD-10-CM

## 2023-07-31 ENCOUNTER — Emergency Department (HOSPITAL_COMMUNITY): Payer: 59

## 2023-07-31 ENCOUNTER — Emergency Department (HOSPITAL_COMMUNITY)
Admission: EM | Admit: 2023-07-31 | Discharge: 2023-08-01 | Disposition: A | Discharge status: Home / Self Care | Payer: 59 | Attending: Emergency Medicine | Admitting: Emergency Medicine

## 2023-07-31 DIAGNOSIS — M25572 Pain in left ankle and joints of left foot: Secondary | ICD-10-CM | POA: Insufficient documentation

## 2023-07-31 NOTE — ED Triage Notes (Signed)
Patient states he was walking to bedroom and all of a sudden he had severe pain in his left foot, mainly back of ankle. Hurts to walk or flex foot. No swelling noted. Patient rates pain 9/10.

## 2023-08-01 MED ORDER — NAPROXEN 375 MG PO TABS: 375.0000 mg | ORAL_TABLET | Freq: Two times a day (BID) | ORAL | 0 refills | Status: AC

## 2023-08-01 MED ORDER — ACETAMINOPHEN 500 MG PO TABS
1000.0000 mg | ORAL_TABLET | Freq: Once | ORAL | Status: AC
  Administered 2023-08-01: 1000 mg via ORAL
  Filled 2023-08-01: qty 2

## 2023-08-01 MED ORDER — IBUPROFEN 800 MG PO TABS
800.0000 mg | ORAL_TABLET | Freq: Once | ORAL | Status: AC
  Administered 2023-08-01: 800 mg via ORAL
  Filled 2023-08-01: qty 1

## 2023-08-01 NOTE — ED Provider Notes (Signed)
[place] EMERGENCY DEPARTMENT AT [facility] Provider Note   CSN: [id number] Arrival date & time: 07/31/23  2218     History  No chief complaint on file.   Tyler Atkins is a 47 y.o. male.  The history is provided by the patient.  Ankle Pain Location:  Ankle Lower extremity injury: kicked off flip flop and flexed foot.   Chronicity:  New Dislocation: no   Relieved by:  Nothing Worsened by:  Nothing Ineffective treatments:  None tried Associated symptoms: no back pain and no fever   Risk factors: no concern for non-accidental trauma        Home Medications Prior to Admission medications   Medication Sig Start Date End Date Taking? Authorizing Provider  naproxen (NAPROSYN) 375 MG tablet Take 1 tablet (375 mg total) by mouth 2 (two) times daily with a meal. 08/01/23  Yes Maki Sweetser, MD  Continuous Blood Gluc Receiver (DEXCOM G6 RECEIVER) DEVI Test 1 to 2 times daily 06/20/20   Georgina Quint, MD  Continuous Blood Gluc Sensor (DEXCOM G6 SENSOR) MISC USE TO TEST 1-2 TIMES DAILY 04/07/21   Georgina Quint, MD  Continuous Blood Gluc Transmit (DEXCOM G6 TRANSMITTER) MISC USE TO TEST 1-2 TIMES DAILY 01/04/21   Georgina Quint, MD  metFORMIN (GLUCOPHAGE) 1000 MG tablet TAKE 1 TABLET BY MOUTH TWICE A DAY WITH MEALS 06/11/21   Georgina Quint, MD  rosuvastatin (CRESTOR) 10 MG tablet Take 1 tablet (10 mg total) by mouth daily. 09/12/20   Georgina Quint, MD      Allergies    Patient has no known allergies.    Review of Systems   Review of Systems  Constitutional:  Negative for fever.  Gastrointestinal:  Negative for vomiting.  Musculoskeletal:  Positive for arthralgias. Negative for back pain.  All other systems reviewed and are negative.   Physical Exam Updated Vital Signs BP 131/86   Pulse 73   Temp 98.9 F (37.2 C) (Oral)   Resp 18   Ht 6\' 3"  (1.905 m)   Wt 133.8 kg   SpO2 98%   BMI 36.87 kg/m  Physical Exam Vitals  reviewed.  Constitutional:      General: He is not in acute distress.    Appearance: Normal appearance. He is well-developed. He is not diaphoretic.  HENT:     Head: Normocephalic and atraumatic.  Eyes:     Conjunctiva/sclera: Conjunctivae normal.     Pupils: Pupils are equal, round, and reactive to light.  Cardiovascular:     Rate and Rhythm: Normal rate and regular rhythm.     Heart sounds: Normal heart sounds.  Pulmonary:     Effort: Pulmonary effort is normal.     Breath sounds: Normal breath sounds. No wheezing or rales.  Abdominal:     General: Bowel sounds are normal.     Palpations: Abdomen is soft.     Tenderness: There is no abdominal tenderness. There is no guarding or rebound.  Musculoskeletal:        General: Normal range of motion.     Cervical back: Normal range of motion and neck supple.     Left lower leg: Normal. No swelling. No edema.     Left ankle: Normal. No tenderness. Normal range of motion.     Left Achilles Tendon: No tenderness. Thompson's test negative.     Left foot: Normal. Normal capillary refill. No bony tenderness or crepitus.  Skin:    General: Skin  is warm and dry.  Neurological:     Mental Status: He is alert and oriented to person, place, and time.     Deep Tendon Reflexes: Reflexes normal.  Psychiatric:        Behavior: Behavior normal.     ED Results / Procedures / Treatments   Labs (all labs ordered are listed, but only abnormal results are displayed) Labs Reviewed - No data to display  EKG None  Radiology DG Ankle Left Port  Result Date: 07/31/2023 CLINICAL DATA:  Trauma to the left lower extremity.  Pain. EXAM: PORTABLE LEFT ANKLE - 2 VIEW; LEFT FOOT - COMPLETE 3+ VIEW COMPARISON:  None Available. FINDINGS: There is no evidence of fracture, dislocation, or joint effusion. There is no evidence of arthropathy or other focal bone abnormality. Soft tissues are unremarkable. IMPRESSION: Negative. Electronically Signed   By: Elgie Collard M.D.   On: 07/31/2023 23:28   DG Foot Complete Left  Result Date: 07/31/2023 CLINICAL DATA:  Trauma to the left lower extremity.  Pain. EXAM: PORTABLE LEFT ANKLE - 2 VIEW; LEFT FOOT - COMPLETE 3+ VIEW COMPARISON:  None Available. FINDINGS: There is no evidence of fracture, dislocation, or joint effusion. There is no evidence of arthropathy or other focal bone abnormality. Soft tissues are unremarkable. IMPRESSION: Negative. Electronically Signed   By: Elgie Collard M.D.   On: 07/31/2023 23:28    Procedures Procedures    Medications Ordered in ED Medications  ibuprofen (ADVIL) tablet 800 mg (800 mg Oral Given 08/01/23 0038)  acetaminophen (TYLENOL) tablet 1,000 mg (1,000 mg Oral Given 08/01/23 0037)    ED Course/ Medical Decision Making/ A&P                                 Medical Decision Making Left ankle pain since flexing foot kicking off flip flop   Amount and/or Complexity of Data Reviewed Independent Historian: spouse    Details: See above  External Data Reviewed: notes.    Details: Previous notes reviewed  Radiology: ordered and independent interpretation performed.    Details: Normal Xrays by me   Risk OTC drugs. Prescription drug management. Risk Details: Patient flexed foot, this is likely a sprain.  Xrays and exam are benign and reassuring.  Ice, elevation and NSAIDs.      Final Clinical Impression(s) / ED Diagnoses Final diagnoses:  Acute left ankle pain   Return for intractable cough, coughing up blood, fevers > 100.4 unrelieved by medication, shortness of breath, intractable vomiting, chest pain, shortness of breath, weakness, numbness, changes in speech, facial asymmetry, abdominal pain, passing out, Inability to tolerate liquids or food, cough, altered mental status or any concerns. No signs of systemic illness or infection. The patient is nontoxic-appearing on exam and vital signs are within normal limits.  I have reviewed the triage vital signs  and the nursing notes. Pertinent labs & imaging results that were available during my care of the patient were reviewed by me and considered in my medical decision making (see chart for details). After history, exam, and medical workup I feel the patient has been appropriately medically screened and is safe for discharge home. Pertinent diagnoses were discussed with the patient. Patient was given return precautions.  Rx / DC Orders ED Discharge Orders          Ordered    naproxen (NAPROSYN) 375 MG tablet  2 times daily with meals  08/01/23 0155              Dorethea Strubel, MD 08/01/23 3664
